# Patient Record
Sex: Female | Born: 1985
Health system: Southern US, Community
[De-identification: ages and names within clinical notes are randomized; demographics above are authoritative.]

## PROBLEM LIST (undated history)

## (undated) DIAGNOSIS — L309 Dermatitis, unspecified: Secondary | ICD-10-CM

## (undated) DIAGNOSIS — I10 Essential (primary) hypertension: Secondary | ICD-10-CM

## (undated) DIAGNOSIS — E669 Obesity, unspecified: Secondary | ICD-10-CM

## (undated) DIAGNOSIS — K219 Gastro-esophageal reflux disease without esophagitis: Secondary | ICD-10-CM

## (undated) DIAGNOSIS — K5900 Constipation, unspecified: Secondary | ICD-10-CM

## (undated) DIAGNOSIS — F419 Anxiety disorder, unspecified: Secondary | ICD-10-CM

## (undated) DIAGNOSIS — M1990 Unspecified osteoarthritis, unspecified site: Secondary | ICD-10-CM

## (undated) HISTORY — DX: Gastro-esophageal reflux disease without esophagitis: K21.9

## (undated) HISTORY — PX: TONSILLECTOMY: SUR1361

## (undated) HISTORY — DX: Obesity, unspecified: E66.9

## (undated) HISTORY — DX: Constipation, unspecified: K59.00

## (undated) HISTORY — DX: Anxiety disorder, unspecified: F41.9

## (undated) HISTORY — PX: NO PAST SURGERIES: SHX2092

## (undated) HISTORY — DX: Unspecified osteoarthritis, unspecified site: M19.90

## (undated) HISTORY — DX: Dermatitis, unspecified: L30.9

## (undated) HISTORY — DX: Essential (primary) hypertension: I10

---

## 2008-01-09 ENCOUNTER — Emergency Department: Payer: Self-pay | Admitting: Emergency Medicine

## 2008-01-31 ENCOUNTER — Emergency Department: Payer: Self-pay | Admitting: Emergency Medicine

## 2008-03-01 ENCOUNTER — Observation Stay: Payer: Self-pay | Admitting: Obstetrics & Gynecology

## 2008-04-26 ENCOUNTER — Observation Stay: Payer: Self-pay | Admitting: Obstetrics and Gynecology

## 2008-05-31 ENCOUNTER — Observation Stay: Payer: Self-pay

## 2008-06-15 ENCOUNTER — Inpatient Hospital Stay: Payer: Self-pay | Admitting: Obstetrics and Gynecology

## 2008-11-14 ENCOUNTER — Emergency Department: Payer: Self-pay | Admitting: Emergency Medicine

## 2009-03-24 ENCOUNTER — Emergency Department: Payer: Self-pay | Admitting: Emergency Medicine

## 2011-04-15 ENCOUNTER — Emergency Department: Payer: Self-pay | Admitting: Emergency Medicine

## 2011-05-23 ENCOUNTER — Emergency Department: Payer: Self-pay | Admitting: *Deleted

## 2013-05-09 ENCOUNTER — Observation Stay: Payer: Self-pay | Admitting: Obstetrics and Gynecology

## 2013-05-09 LAB — URINALYSIS, COMPLETE
Bacteria: NONE SEEN
Bilirubin,UR: NEGATIVE
Blood: NEGATIVE
Glucose,UR: NEGATIVE mg/dL (ref 0–75)
Ketone: NEGATIVE
Leukocyte Esterase: NEGATIVE
Nitrite: NEGATIVE
Ph: 7 (ref 4.5–8.0)
Protein: NEGATIVE
RBC,UR: NONE SEEN /HPF (ref 0–5)
Specific Gravity: 1.012 (ref 1.003–1.030)
Squamous Epithelial: 1
WBC UR: <1 /HPF (ref 0–5)

## 2013-05-10 LAB — URINE CULTURE

## 2013-05-31 ENCOUNTER — Observation Stay: Payer: Self-pay | Admitting: Obstetrics and Gynecology

## 2013-07-20 ENCOUNTER — Emergency Department: Payer: Self-pay | Admitting: Internal Medicine

## 2013-08-13 ENCOUNTER — Observation Stay: Payer: Self-pay

## 2013-09-10 ENCOUNTER — Observation Stay: Payer: Self-pay | Admitting: Obstetrics and Gynecology

## 2015-04-13 NOTE — H&P (Signed)
L&D Evaluation:  History Expanded:  HPI 29 yo G2 who had sex last night and comes in today with co bleeding. she is 2328w4d and she is not having another problems except for morbib obesity.   Gravida 2   Term 1   PreTerm 0   Abortion 0   Living 1   Blood Type (Maternal) unknown   Group B Strep Results Maternal (Result >5wks must be treated as unknown) positive   Maternal HIV Unknown   Maternal Syphilis Ab Unknown   Maternal Varicella Unknown   Rubella Results (Maternal) unknown   Maternal T-Dap Unknown   [facility]   Presents with vaginal bleeding   Patient's Surgical History none   Medications Pre Natal Vitamins   Allergies NKDA   Social History none   Family History Non-Contributory   ROS:  ROS All systems were reviewed.  HEENT, CNS, GI, GU, Respiratory, CV, Renal and Musculoskeletal systems were found to be normal.   Exam:  Vital Signs stable   Urine Protein not completed   General no apparent distress   Mental Status clear   Chest clear   Heart normal sinus rhythm   Abdomen gravid, non-tender   Back no CVAT   Edema no edema   Mebranes Intact   FHT Description good fht of 140   Ucx absent   Skin dry   Plan:  Plan UA   Comments cervical speculum exam to see if from cervix and check ua   Follow Up Appointment need to schedule   Electronic Signatures: Adria DevonKlett, Haliyah Fryman (MD)  (Signed 06-Jun-14 15:27)  Authored: L&D Evaluation   Last Updated: 06-Jun-14 15:27 by Adria DevonKlett, Saylee Sherrill (MD)

## 2015-04-13 NOTE — H&P (Signed)
L&D Evaluation:  History:  HPI 29 yo G2P1001 at 7945w5d gestational age by LMP and 7 week ultrasound.  She presents with concerns for no fetal movement today.  Her pregnancy is complicated by obesity.  She states that she had an ultrasound yesterday during which the sonographer had difficulty "getting the baby to move."  She has otherwise had no issues with her pregnancy.   Exam:  Vital Signs stable  afebrile.  normal range vs   General no apparent distress   Mental Status clear   Abdomen gravid, non-tender   FHT present in normal range   Other Abdominal ultrasound performed by me.  This demonstrated a single, viable Intrauterine pregnancy with very frequent movement. Heart rate in normal range.  Amniotic fluid subjectively normal.   Impression:  Impression Reassuring fetal status   Plan:  Comments provided reassurance to patient.  Showed her the monitor while I was performing ultrasound to demonstrate fetal movement.  She voiced that she was able to see and appreciate the fetal movement and noted that she was not feeling the movement of the baby even though she could see good movement on the screen.  She has an appointment in two day.  She was encouraged to keep this appt.   Follow Up App[facility]ment already scheduled. [facility]   Electronic Signatures: Conard NovakJackson, Velvie Thomaston D (MD)  (Signed 28-Jun-14 17:19)  Authored: L&D Evaluation   Last Updated: 28-Jun-14 17:19 by Conard NovakJackson, Ahyan Kreeger D (MD)

## 2015-04-13 NOTE — H&P (Signed)
L&D Evaluation:  History Expanded:  HPI 29yo G2P1 at 38w 2days who is a [facility] patient due top pt morbid obesity. she has had a vaginal delivery before. the pateint cane here tonight as she has not been able to feel the baby move. she is not feeling any contractions.   Gravida 2   Term 1   PreTerm 0   Abortion 0   Living 1   Blood Type (Maternal) O positive   Group B Strep Results Maternal (Result >5wks must be treated as unknown) unknown/result > 5 weeks ago   Maternal HIV Negative   Maternal Syphilis Ab Nonreactive   Maternal Varicella Immune   Rubella Results (Maternal) equivocal   Maternal T-Dap Unknown   [facility]   Presents with decreased fetal movement   Patient's Medical History obesity   Patient's Surgical History none   Medications Pre Natal Vitamins   Allergies Sulfa, swelluing   Social History none   Family History Non-Contributory   Current Prenatal Course Notable For Morbid Obesity   ROS:  ROS All systems were reviewed.  HEENT, CNS, GI, GU, Respiratory, CV, Renal and Musculoskeletal systems were found to be normal.   Exam:  Vital Signs stable   Urine Protein not completed   General no apparent distress   Mental Status clear   Chest clear   Heart normal sinus rhythm   Abdomen gravid, non-tender   Estimated Fetal Weight Average for gestational age   Fundal Height term woth lrge pannus   Back no CVAT   Edema no edema   Pelvic no external lesions, cervix closed and thick   Mebranes Intact   FHT Description Decreased Variability, decreased long and short term variability, baby moving all over the place and audibly heard and seen and pt states she can not feel what is being observed.   Ucx irregular   Skin dry   Lymph no lymphadenopathy   Impression:  Impression decreased fetal movement   Plan:  Plan EFM/NST   Comments pt adviseda s to when the baby is moving by sound and loss of fetal heart tracing and she  strill states she can not feel the baby move. the tracing is decreased lon and short term variabiliyt. will start IVF of D5LR. if fetus does not become strictly reactive BPP will be done and then decsiion for delivery and mode will be made. the patient is made aware that if baby does not perk up that a csection would be in order and she is not happy about the possibility, the gma asks [facility] explained that unless the tracing is reassuring she can not be transferred. if baby perks up and is reactive for a time-then shse will be discharged. gma also asks if we can call [facility] and have them come over to help. i explained how that is not a possibility. any delivery if urgent would have to be done here. since fetus is moving all over will give this a little time.   Electronic Signatures: Adria DevonKlett, Carlethia Mesquita (MD)  (Signed 09-Oct-14 00:16)  Authored: L&D Evaluation   Last Updated: 09-Oct-14 00:16 by Adria DevonKlett, Neleh Muldoon (MD)

## 2015-11-17 ENCOUNTER — Ambulatory Visit (INDEPENDENT_AMBULATORY_CARE_PROVIDER_SITE_OTHER): Payer: BLUE CROSS/BLUE SHIELD | Admitting: Physician Assistant

## 2015-11-17 VITALS — BP 122/80 | HR 80 | Temp 98.3°F | Resp 14 | Ht 65.0 in | Wt 302.0 lb

## 2015-11-17 DIAGNOSIS — B379 Candidiasis, unspecified: Secondary | ICD-10-CM

## 2015-11-17 DIAGNOSIS — N3000 Acute cystitis without hematuria: Secondary | ICD-10-CM | POA: Diagnosis not present

## 2015-11-17 DIAGNOSIS — N898 Other specified noninflammatory disorders of vagina: Secondary | ICD-10-CM

## 2015-11-17 LAB — POCT URINALYSIS DIP (MANUAL ENTRY)
Bilirubin, UA: NEGATIVE
Glucose, UA: NEGATIVE
Nitrite, UA: POSITIVE — AB
Protein Ur, POC: NEGATIVE
Spec Grav, UA: 1.025
Urobilinogen, UA: 1
pH, UA: 6

## 2015-11-17 LAB — POCT WET + KOH PREP: Trich by wet prep: ABSENT

## 2015-11-17 LAB — POC MICROSCOPIC URINALYSIS (UMFC): Mucus: ABSENT

## 2015-11-17 MED ORDER — NYSTATIN-TRIAMCINOLONE 100000-0.1 UNIT/GM-% EX OINT: 1.0000 "application " | TOPICAL_OINTMENT | Freq: Two times a day (BID) | CUTANEOUS | Status: DC

## 2015-11-17 MED ORDER — FLUCONAZOLE 150 MG PO TABS: 150.0000 mg | ORAL_TABLET | Freq: Once | ORAL | Status: DC

## 2015-11-17 MED ORDER — NITROFURANTOIN MONOHYD MACRO 100 MG PO CAPS: 100.0000 mg | ORAL_CAPSULE | Freq: Two times a day (BID) | ORAL | Status: AC

## 2015-11-17 NOTE — Patient Instructions (Signed)
Urinary Tract Infection °Urinary tract infections (UTIs) can develop anywhere along your urinary tract. Your urinary tract is your body's drainage system for removing wastes and extra water. Your urinary tract includes two kidneys, two ureters, a bladder, and a urethra. Your kidneys are a pair of bean-shaped organs. Each kidney is about the size of your fist. They are located below your ribs, one on each side of your spine. °CAUSES °Infections are caused by microbes, which are microscopic organisms, including fungi, viruses, and bacteria. These organisms are so small that they can only be seen through a microscope. Bacteria are the microbes that most commonly cause UTIs. °SYMPTOMS  °Symptoms of UTIs may vary by age and gender of the patient and by the location of the infection. Symptoms in young women typically include a frequent and intense urge to urinate and a painful, burning feeling in the bladder or urethra during urination. Older women and men are more likely to be tired, shaky, and weak and have muscle aches and abdominal pain. A fever may mean the infection is in your kidneys. Other symptoms of a kidney infection include pain in your back or sides below the ribs, nausea, and vomiting. °DIAGNOSIS °To diagnose a UTI, your caregiver will ask you about your symptoms. Your caregiver will also ask you to provide a urine sample. The urine sample will be tested for bacteria and white blood cells. White blood cells are made by your body to help fight infection. °TREATMENT  °Typically, UTIs can be treated with medication. Because most UTIs are caused by a bacterial infection, they usually can be treated with the use of antibiotics. The choice of antibiotic and length of treatment depend on your symptoms and the type of bacteria causing your infection. °HOME CARE INSTRUCTIONS °· If you were prescribed antibiotics, take them exactly as your caregiver instructs you. Finish the medication even if you feel better after  you have only taken some of the medication. °· Drink enough water and fluids to keep your urine clear or pale yellow. °· Avoid caffeine, tea, and carbonated beverages. They tend to irritate your bladder. °· Empty your bladder often. Avoid holding urine for long periods of time. °· Empty your bladder before and after sexual intercourse. °· After a bowel movement, women should cleanse from front to back. Use each tissue only once. °SEEK MEDICAL CARE IF:  °· You have back pain. °· You develop a fever. °· Your symptoms do not begin to resolve within 3 days. °SEEK IMMEDIATE MEDICAL CARE IF:  °· You have severe back pain or lower abdominal pain. °· You develop chills. °· You have nausea or vomiting. °· You have continued burning or discomfort with urination. °MAKE SURE YOU:  °· Understand these instructions. °· Will watch your condition. °· Will get help right away if you are not doing well or get worse. °  °This information is not intended to replace advice given to you by your health care provider. Make sure you discuss any questions you have with your health care provider. °  °Document Released: 08/30/2005 Document Revised: 08/11/2015 Document Reviewed: 12/29/2011 °Elsevier Interactive Patient Education ©2016 [employer] Inc[employer] ° °Monilial Vaginitis °Vaginitis in a soreness, swelling and redness (inflammation) of the vagina and vulva. Monilial vaginitis is not a sexually transmitted infection. °CAUSES  °Yeast vaginitis is caused by yeast (candida) that is normally found in your vagina. With a yeast infection, the candida has overgrown in number to a point that upsets the chemical balance. °SYMPTOMS  °·   White, thick vaginal discharge. °· Swelling, itching, redness and irritation of the vagina and possibly the lips of the vagina (vulva). °· Burning or painful urination. °· Painful intercourse. °DIAGNOSIS  °Things that may contribute to monilial vaginitis are: °· Postmenopausal and virginal  states. °· Pregnancy. °· Infections. °· Being tired, sick or stressed, especially if you had monilial vaginitis in the past. °· Diabetes. Good control will help lower the chance. °· Birth control pills. °· Tight fitting garments. °· Using bubble bath, feminine sprays, douches or deodorant tampons. °· Taking certain medications that kill germs (antibiotics). °· Sporadic recurrence can occur if you become ill. °TREATMENT  °Your caregiver will give you medication. °· There are several kinds of anti monilial vaginal creams and suppositories specific for monilial vaginitis. For recurrent yeast infections, use a suppository or cream in the vagina 2 times a week, or as directed. °· Anti-monilial or steroid cream for the itching or irritation of the vulva may also be used. Get your caregiver's permission. °· Painting the vagina with methylene blue solution may help if the monilial cream does not work. °· Eating yogurt may help prevent monilial vaginitis. °HOME CARE INSTRUCTIONS  °· Finish all medication as prescribed. °· Do not have sex until treatment is completed or after your caregiver tells you it is okay. °· Take warm sitz baths. °· Do not douche. °· Do not use tampons, especially scented ones. °· Wear cotton underwear. °· Avoid tight pants and panty hose. °· Tell your sexual partner that you have a yeast infection. They should go to their caregiver if they have symptoms such as mild rash or itching. °· Your sexual partner should be treated as well if your infection is difficult to eliminate. °· Practice safer sex. Use condoms. °· Some vaginal medications cause latex condoms to fail. Vaginal medications that harm condoms are: °¨ Cleocin cream. °¨ Butoconazole (Femstat®). °¨ Terconazole (Terazol®) vaginal suppository. °¨ Miconazole (Monistat®) (may be purchased over the counter). °SEEK MEDICAL CARE IF:  °· You have a temperature by mouth above 102° F (38.9° C). °· The infection is getting worse after 2 days of  treatment. °· The infection is not getting better after 3 days of treatment. °· You develop blisters in or around your vagina. °· You develop vaginal bleeding, and it is not your menstrual period. °· You have pain when you urinate. °· You develop intestinal problems. °· You have pain with sexual intercourse. °  °This information is not intended to replace advice given to you by your health care provider. Make sure you discuss any questions you have with your health care provider. °  °Document Released: 08/30/2005 Document Revised: 02/12/2012 Document Reviewed: 05/24/2015 °Elsevier Interactive Patient Education ©2016 Elsevier Inc. ° °

## 2015-11-17 NOTE — Progress Notes (Signed)
[facility] [address], [place] [address] [phone]  Date:  11/17/2015   Name[facility]  [facility] R [facility]   DOB:  [birth year]   MRN:  [id number]  PCP:  No primary care provider on file.    History of Present Illness:  Kathryn Harvey is a 29 y.o. female patient who [facility].  Burns when she wipes with tissue.  There is no frequency or hematuria.  She denies fever, nausea, or abdominal pain.  She has attempted azo which offered relief.  Abnormal vaginal discharge which appears orange.  Mild vaginal pruritus. Sexually active-1 month ago with a condom.   No back pain or flank pain.  Started afterward.  Stinging burning.   There are no active problems to display for this patient.   No past medical history on file.  No past surgical history on file.  Social History  Substance Use Topics  . Smoking status: Current Every Day Smoker -- 2 years    Types: Cigars  . Smokeless tobacco: Not on file  . Alcohol Use: No    No family history on file.  Allergies  Allergen Reactions  . Sulfa Antibiotics Swelling    Medication list has been reviewed and updated.  No current outpatient prescriptions on file prior to visit.   No current facility-administered medications on file prior to visit.    ROS ROS otherwise unremarkable unless listed above.  Physical Examination: BP 122/80 mmHg  Pulse 80  Temp(Src) 98.3 F (36.8 C) (Oral)  Resp 14  Ht 5\' 5"  (1.651 m)  Wt 302 lb (136.986 kg)  BMI 50.26 kg/m2  SpO2 98% Ideal Body Weight: Weight in (lb) to have BMI = 25: 149.9  Physical Exam  Constitutional: She is oriented to person, place, and time. She appears well-developed and well-nourished. No distress.  HENT:  Head: Normocephalic and atraumatic.  Right Ear: External ear normal.  Left Ear: External ear normal.  Eyes: Conjunctivae and EOM are normal. Pupils are equal, round, and reactive to light.  Cardiovascular:  Normal rate.   Pulmonary/Chest: Effort normal. No respiratory distress.  Genitourinary: Pelvic exam was performed with patient supine. There is no rash on the right labia. There is no rash on the left labia. Vaginal discharge found.  Neurological: She is alert and oriented to person, place, and time.  Skin: She is not diaphoretic.  Psychiatric: She has a normal mood and affect. Her behavior is normal.    Assessment and Plan: Kathryn Harvey is a 29 y.o. female who is here today for vaginal pruritus and dysuria.   -treating with microbid.   -given diflucan, and nystatin to treat vaginal pruritus.  -gc chlamydia obtained today Acute cystitis without hematuria - Plan: nitrofurantoin, macrocrystal-monohydrate, (MACROBID) 100 MG capsule, Urine culture  Vaginal discharge - Plan: POCT Wet + KOH Prep, POCT Microscopic Urinalysis ([facility]), GC/Chlamydia Probe Amp, POCT urinalysis dipstick  Vaginal irritation - Plan: nystatin-triamcinolone ointment (MYCOLOG)  Yeast infection - Plan: fluconazole (DIFLUCAN) 150 MG tablet   Trena PlattStephanie Stetson Pelaez, PA-C [facility] 11/17/2015 1:01 PM

## 2015-11-18 LAB — URINE CULTURE: Colony Count: 40000

## 2015-11-18 LAB — GC/CHLAMYDIA PROBE AMP
CT Probe RNA: NOT DETECTED
GC Probe RNA: NOT DETECTED

## 2016-01-07 ENCOUNTER — Emergency Department (HOSPITAL_COMMUNITY)
Admission: EM | Admit: 2016-01-07 | Discharge: 2016-01-07 | Disposition: A | Discharge status: Home / Self Care | Payer: BLUE CROSS/BLUE SHIELD | Attending: Physician Assistant | Admitting: Physician Assistant

## 2016-01-07 ENCOUNTER — Encounter (HOSPITAL_COMMUNITY): Payer: Self-pay | Admitting: *Deleted

## 2016-01-07 DIAGNOSIS — K002 Abnormalities of size and form of teeth: Secondary | ICD-10-CM | POA: Diagnosis not present

## 2016-01-07 DIAGNOSIS — Z79899 Other long term (current) drug therapy: Secondary | ICD-10-CM | POA: Diagnosis not present

## 2016-01-07 DIAGNOSIS — F17210 Nicotine dependence, cigarettes, uncomplicated: Secondary | ICD-10-CM | POA: Diagnosis not present

## 2016-01-07 DIAGNOSIS — K0889 Other specified disorders of teeth and supporting structures: Secondary | ICD-10-CM | POA: Diagnosis present

## 2016-01-07 DIAGNOSIS — G478 Other sleep disorders: Secondary | ICD-10-CM | POA: Diagnosis not present

## 2016-01-07 DIAGNOSIS — K029 Dental caries, unspecified: Secondary | ICD-10-CM | POA: Insufficient documentation

## 2016-01-07 MED ORDER — AMOXICILLIN 500 MG PO CAPS: 500.0000 mg | ORAL_CAPSULE | Freq: Three times a day (TID) | ORAL | Status: DC

## 2016-01-07 NOTE — Discharge Instructions (Signed)

## 2016-01-07 NOTE — ED Notes (Signed)
Pt c/o right lower jaw dental pain; pt states that she has had an infection in that area in the past; pt states that she is scheduled for a tooth extraction on 2/24; pt states that she began hurting on Wed and has been unable to get relief

## 2016-01-07 NOTE — ED Provider Notes (Signed)
CSN: [id number]     Arrival date & time 01/07/16  2225 History   First MD Initiated Contact with Patient 01/07/16 2244     Chief Complaint  Patient presents with  . Dental Pain     (Consider location/radiation/quality/duration/timing/severity/associated sxs/prior Treatment) HPI Comments: Patient presents to the emergency department with chief complaint of bilateral lower dental pain. Patient states that she is scheduled for wisdom tooth extraction on 2/24. She states that her teeth began hurting her 2 days ago. She denies fevers or chills. She has tried OTC medications with no relief. She states that she was taking amoxicillin for an ear infection, and this helped with her dental pain. She is concerned that she might be developing further infection. There are no other associated symptoms.  The history is provided by the patient. No language interpreter was used.    History reviewed. No pertinent past medical history. History reviewed. No pertinent past surgical history. No family history on file. Social History  Substance Use Topics  . Smoking status: Current Every Day Smoker -- 2 years    Types: Cigars  . Smokeless tobacco: None  . Alcohol Use: No   OB History    No data available     Review of Systems  Constitutional: Negative for fever and chills.  HENT: Positive for dental problem. Negative for drooling.   Neurological: Negative for speech difficulty.  Psychiatric/Behavioral: Positive for sleep disturbance.      Allergies  Sulfa antibiotics  Home Medications   Prior to Admission medications   Medication Sig Start Date End Date Taking? Authorizing Provider  ferrous sulfate 325 (65 FE) MG tablet Take 325 mg by mouth daily with breakfast.   Yes Historical Provider, MD  ibuprofen (ADVIL,MOTRIN) 200 MG tablet Take 800 mg by mouth every 6 (six) hours as needed for moderate pain.   Yes Historical Provider, MD  PRESCRIPTION MEDICATION Take 1 tablet by mouth daily. OCPs   Yes  Historical Provider, MD  fluconazole (DIFLUCAN) 150 MG tablet Take 1 tablet (150 mg total) by mouth once. Repeat if needed Patient not taking: Reported on 01/07/2016 11/17/15   Collie Siad English, PA  nystatin-triamcinolone ointment [facility]) Apply 1 application topically 2 (two) times daily. Patient not taking: Reported on 01/07/2016 11/17/15   Collie Siad English, PA   BP 169/113 mmHg  Pulse 75  Temp(Src) 98.5 F (36.9 C) (Oral)  Resp 20  SpO2 98%  LMP 12/17/2015 Physical Exam Physical Exam  Constitutional: Pt appears well-developed and well-nourished.  HENT:  Head: Normocephalic.  Right Ear: Tympanic membrane, external ear and ear canal normal.  Left Ear: Tympanic membrane, external ear and ear canal normal.  Nose: Nose normal. Right sinus exhibits no maxillary sinus tenderness and no frontal sinus tenderness. Left sinus exhibits no maxillary sinus tenderness and no frontal sinus tenderness.  Mouth/Throat: Uvula is midline, oropharynx is clear and moist and mucous membranes are normal. No oral lesions. Abnormal dentition. Dental caries present. No uvula swelling or lacerations. No oropharyngeal exudate, posterior oropharyngeal edema, posterior oropharyngeal erythema or tonsillar abscesses.  No gingival swelling, fluctuance or induration No gross abscess  Eyes: Conjunctivae are normal. Pupils are equal, round, and reactive to light. Right eye exhibits no discharge. Left eye exhibits no discharge.  Neck: Normal range of motion. Neck supple.  No stridor Handling secretions without difficulty No nuchal rigidity No cervical lymphadenopathy   Cardiovascular: Normal rate, regular rhythm and normal heart sounds.   Pulmonary/Chest: Effort normal. No respiratory distress.  Equal  chest rise  Abdominal: Soft. Bowel sounds are normal. Pt exhibits no distension. There is no tenderness.  Lymphadenopathy:    Pt has no cervical adenopathy.  Neurological: Pt is alert.  Skin: Skin is warm and dry.   Psychiatric: Pt has a normal mood and affect.  Nursing note and vitals reviewed.    ED Course  Procedures (including critical care time)   MDM   Final diagnoses:  Pain, dental    Patient with toothache.  No gross abscess.  Exam unconcerning for Ludwig's angina or spread of infection.  Will treat with amox and otc pain medicine.  Urged patient to follow-up with dentist.      Roxy Horseman, PA-C 01/07/16 2255  Courteney Randall An, MD 01/08/16 [phone]

## 2017-07-10 ENCOUNTER — Other Ambulatory Visit (HOSPITAL_COMMUNITY)
Admission: RE | Admit: 2017-07-10 | Discharge: 2017-07-10 | Disposition: A | Discharge status: Home / Self Care | Payer: BLUE CROSS/BLUE SHIELD | Source: Ambulatory Visit | Attending: Internal Medicine | Admitting: Internal Medicine

## 2017-07-10 ENCOUNTER — Other Ambulatory Visit: Payer: Self-pay | Admitting: Internal Medicine

## 2017-07-10 DIAGNOSIS — Z124 Encounter for screening for malignant neoplasm of cervix: Secondary | ICD-10-CM | POA: Insufficient documentation

## 2017-07-12 LAB — CYTOLOGY - PAP
Chlamydia: NEGATIVE
Diagnosis: NEGATIVE
Neisseria Gonorrhea: NEGATIVE

## 2019-03-13 DIAGNOSIS — J029 Acute pharyngitis, unspecified: Secondary | ICD-10-CM | POA: Diagnosis not present

## 2019-03-21 ENCOUNTER — Other Ambulatory Visit: Payer: Self-pay

## 2019-03-21 ENCOUNTER — Encounter (HOSPITAL_COMMUNITY): Payer: Self-pay

## 2019-03-21 ENCOUNTER — Emergency Department (HOSPITAL_COMMUNITY)
Admission: EM | Admit: 2019-03-21 | Discharge: 2019-03-21 | Disposition: A | Discharge status: Home / Self Care | Payer: 59 | Attending: Emergency Medicine | Admitting: Emergency Medicine

## 2019-03-21 DIAGNOSIS — R1310 Dysphagia, unspecified: Secondary | ICD-10-CM | POA: Diagnosis not present

## 2019-03-21 DIAGNOSIS — F17290 Nicotine dependence, other tobacco product, uncomplicated: Secondary | ICD-10-CM | POA: Diagnosis not present

## 2019-03-21 DIAGNOSIS — R070 Pain in throat: Secondary | ICD-10-CM | POA: Diagnosis present

## 2019-03-21 MED ORDER — OMEPRAZOLE 20 MG PO CPDR: 20.0000 mg | DELAYED_RELEASE_CAPSULE | Freq: Two times a day (BID) | ORAL | 0 refills | Status: DC

## 2019-03-21 NOTE — ED Notes (Signed)
ED Provider at bedside. 

## 2019-03-21 NOTE — ED Triage Notes (Signed)
Pt here for a sore throat and difficulty swallowing for the last week.  Initially pt saw PCP who told pt to take zyrtec and some OTC decongestant pills. No relief with interventions. A&Ox4, afebrile, states she feels like something is in her throat.

## 2019-03-21 NOTE — ED Provider Notes (Signed)
[facility] Note   CSN: [id number] Arrival date & time: 03/21/19  0112    History   Chief Complaint Chief Complaint  Patient presents with  . Sore Throat    HPI Kathryn Harvey is a 33 y.o. female.     Patient presents to the ED with a chief complaint of difficulty swallowing.  She states that she has been having the trouble for the past week or so.  She states that it feels like the food or drink passes more slowly than normal.  She denies sore throat, fever, chills, or cough.  She states that she does have some discomfort associated with the symptoms.  She reports having tried zyrtec and sudafed with no relief.    The history is provided by the patient. No language interpreter was used.    History reviewed. No pertinent past medical history.  There are no active problems to display for this patient.   History reviewed. No pertinent surgical history.   OB History   No obstetric history on file.      Home Medications    Prior to Admission medications   Medication Sig Start Date End Date Taking? Authorizing Provider  amoxicillin (AMOXIL) 500 MG capsule Take 1 capsule (500 mg total) by mouth 3 (three) times daily. 01/07/16   Roxy HorsemanBrowning, Briggitte Boline, PA-C  ferrous sulfate 325 (65 FE) MG tablet Take 325 mg by mouth daily with breakfast.    [provider]  fluconazole (DIFLUCAN) 150 MG tablet Take 1 tablet (150 mg total) by mouth once. Repeat if needed Patient not taking: Reported on 01/07/2016 11/17/15   Trena PlattEnglish, Stephanie D, PA  ibuprofen (ADVIL,MOTRIN) 200 MG tablet Take 800 mg by mouth every 6 (six) hours as needed for moderate pain.    [provider]  nystatin-triamcinolone ointment (MYCOLOG) Apply 1 application topically 2 (two) times daily. Patient not taking: Reported on 01/07/2016 11/17/15   Trena PlattEnglish, Stephanie D, PA  omeprazole (PRILOSEC) 20 MG capsule Take 1 capsule (20 mg total) by mouth 2 (two)  times daily before a meal. 03/21/19   Roxy HorsemanBrowning, Shamell Suarez, PA-C  PRESCRIPTION MEDICATION Take 1 tablet by mouth daily. OCPs    [provider]    Family History History reviewed. No pertinent family history.  Social History Social History   Tobacco Use  . Smoking status: Current Every Day Smoker    Years: 2.00    Types: Cigars  Substance Use Topics  . Alcohol use: No    Alcohol/week: 0.0 standard drinks  . Drug use: No     Allergies   Sulfa antibiotics   Review of Systems Review of Systems  All other systems reviewed and are negative.    Physical Exam Updated Vital Signs BP 123/67 (BP Location: Right Arm)   Pulse 73   Temp 98.8 F (37.1 C) (Oral)   Resp 18   SpO2 99%   Physical Exam Vitals signs and nursing note reviewed.  Constitutional:      General: She is not in acute distress.    Appearance: She is well-developed.  HENT:     Head: Normocephalic and atraumatic.     Mouth/Throat:     Comments: Oropharynx is clear, no exudate, no abscess, normal phonation, no stridor Eyes:     Conjunctiva/sclera: Conjunctivae normal.  Neck:     Musculoskeletal: Neck supple.  Cardiovascular:     Rate and Rhythm: Normal rate and regular rhythm.     Heart sounds:  No murmur.  Pulmonary:     Effort: Pulmonary effort is normal. No respiratory distress.     Breath sounds: Normal breath sounds.  Abdominal:     Palpations: Abdomen is soft.     Tenderness: There is no abdominal tenderness.  Skin:    General: Skin is warm and dry.  Neurological:     Mental Status: She is alert.      ED Treatments / Results  Labs (all labs ordered are listed, but only abnormal results are displayed) Labs Reviewed - No data to display  EKG None  Radiology No results found.  Procedures Procedures (including critical care time)  Medications Ordered in ED Medications - No data to display   Initial Impression / Assessment and Plan / ED Course  I have reviewed the triage  vital signs and the nursing notes.  Pertinent labs & imaging results that were available during my care of the patient were reviewed by me and considered in my medical decision making (see chart for details).        Patient with reported difficultly swallowing x 1 week.  Reports taking longer to swallow foods, and that it feels like it gets stuck.  She is able to swallow food and drink, and has not had vomiting.    She states that she has tried zyrtec and sudafed with no relief.    Oropharynx is clear, doubt infection.  VSS.  Consider GERD vs dysmotility disorder. Will treat with omeprazole and recommend follow-up with GI.  Return precautions discussed.  Final Clinical Impressions(s) / ED Diagnoses   Final diagnoses:  Dysphagia, unspecified type    ED Discharge Orders         Ordered    omeprazole (PRILOSEC) 20 MG capsule  2 times daily before meals     03/21/19 0145           Roxy Horseman, PA-C 03/21/19 0151    Glynn Octave, MD 03/21/19 [phone]

## 2019-03-21 NOTE — ED Notes (Signed)
Patient Alert and oriented to baseline. Stable and ambulatory to baseline. Patient verbalized understanding of the discharge instructions.  Patient belongings were taken by the patient.   

## 2019-06-02 DIAGNOSIS — I10 Essential (primary) hypertension: Secondary | ICD-10-CM | POA: Diagnosis not present

## 2019-06-02 DIAGNOSIS — Z3041 Encounter for surveillance of contraceptive pills: Secondary | ICD-10-CM | POA: Diagnosis not present

## 2019-06-02 DIAGNOSIS — Z01419 Encounter for gynecological examination (general) (routine) without abnormal findings: Secondary | ICD-10-CM | POA: Diagnosis not present

## 2019-06-02 DIAGNOSIS — Z6841 Body Mass Index (BMI) 40.0 and over, adult: Secondary | ICD-10-CM | POA: Diagnosis not present

## 2019-06-02 DIAGNOSIS — Z113 Encounter for screening for infections with a predominantly sexual mode of transmission: Secondary | ICD-10-CM | POA: Diagnosis not present

## 2019-07-04 DIAGNOSIS — I10 Essential (primary) hypertension: Secondary | ICD-10-CM | POA: Diagnosis not present

## 2019-07-04 DIAGNOSIS — K219 Gastro-esophageal reflux disease without esophagitis: Secondary | ICD-10-CM | POA: Diagnosis not present

## 2019-07-23 DIAGNOSIS — Z720 Tobacco use: Secondary | ICD-10-CM | POA: Diagnosis not present

## 2019-07-23 DIAGNOSIS — K219 Gastro-esophageal reflux disease without esophagitis: Secondary | ICD-10-CM | POA: Diagnosis not present

## 2019-07-23 DIAGNOSIS — R221 Localized swelling, mass and lump, neck: Secondary | ICD-10-CM | POA: Diagnosis not present

## 2019-07-28 ENCOUNTER — Other Ambulatory Visit: Payer: Self-pay | Admitting: Internal Medicine

## 2019-07-28 DIAGNOSIS — R221 Localized swelling, mass and lump, neck: Secondary | ICD-10-CM

## 2019-08-06 ENCOUNTER — Ambulatory Visit
Admission: RE | Admit: 2019-08-06 | Discharge: 2019-08-06 | Disposition: A | Discharge status: Home / Self Care | Payer: 59 | Source: Ambulatory Visit | Attending: Internal Medicine | Admitting: Internal Medicine

## 2019-08-06 DIAGNOSIS — E049 Nontoxic goiter, unspecified: Secondary | ICD-10-CM | POA: Diagnosis not present

## 2019-08-06 DIAGNOSIS — R221 Localized swelling, mass and lump, neck: Secondary | ICD-10-CM

## 2019-10-06 ENCOUNTER — Ambulatory Visit
Admission: RE | Admit: 2019-10-06 | Discharge: 2019-10-06 | Disposition: A | Discharge status: Home / Self Care | Payer: 59 | Source: Ambulatory Visit | Attending: Internal Medicine | Admitting: Internal Medicine

## 2019-10-06 ENCOUNTER — Other Ambulatory Visit: Payer: Self-pay | Admitting: Internal Medicine

## 2019-10-06 DIAGNOSIS — M25461 Effusion, right knee: Secondary | ICD-10-CM

## 2019-10-06 DIAGNOSIS — M7989 Other specified soft tissue disorders: Secondary | ICD-10-CM | POA: Diagnosis not present

## 2019-10-09 DIAGNOSIS — M25561 Pain in right knee: Secondary | ICD-10-CM | POA: Diagnosis not present

## 2019-10-29 DIAGNOSIS — R5383 Other fatigue: Secondary | ICD-10-CM | POA: Diagnosis not present

## 2019-10-29 DIAGNOSIS — I10 Essential (primary) hypertension: Secondary | ICD-10-CM | POA: Diagnosis not present

## 2019-11-06 ENCOUNTER — Other Ambulatory Visit: Payer: Self-pay | Admitting: General Surgery

## 2019-11-06 ENCOUNTER — Other Ambulatory Visit (HOSPITAL_COMMUNITY): Payer: Self-pay | Admitting: General Surgery

## 2019-12-15 ENCOUNTER — Ambulatory Visit (HOSPITAL_COMMUNITY)
Admission: RE | Admit: 2019-12-15 | Discharge: 2019-12-15 | Disposition: A | Discharge status: Home / Self Care | Payer: No Typology Code available for payment source | Source: Ambulatory Visit | Attending: General Surgery | Admitting: General Surgery

## 2019-12-15 ENCOUNTER — Other Ambulatory Visit: Payer: Self-pay

## 2019-12-24 ENCOUNTER — Encounter: Payer: Self-pay | Admitting: Dietician

## 2019-12-24 ENCOUNTER — Other Ambulatory Visit: Payer: Self-pay

## 2019-12-24 ENCOUNTER — Encounter: Payer: No Typology Code available for payment source | Attending: General Surgery | Admitting: Dietician

## 2019-12-24 DIAGNOSIS — E669 Obesity, unspecified: Secondary | ICD-10-CM | POA: Insufficient documentation

## 2019-12-24 NOTE — Progress Notes (Signed)
Nutrition Assessment for Bariatric Surgery Medical Nutrition Therapy  Appt Start Time: 8:50am    End Time: 9:45am  Patient was seen on 12/24/2019 for Pre-Operative Nutrition Assessment. Letter of approval faxed to [facility] bariatric surgery program coordinator on 12/24/2019.   Referral stated Supervised Weight Loss (SWL) visits needed: 0  Planned surgery: Sleeve Pt expectation of surgery: to maintain weight loss, come off of high blood pressure medication, be more active  Pt expectation of dietitian: answer questions about diet-related expectations before/after surgery   NUTRITION ASSESSMENT   Anthropometrics  Start weight at [facility]: 285.7 lbs (date: 12/24/2019) Height: 65 in BMI: 47.5 kg/m2     Lifestyle & Dietary Hx Patient works for [employer]. Lives with her daughters. Patient performs the food shopping/preparation, typical meal pattern is ~3 meals per day, may eat out 2-3 times per week.    24-Hr Dietary Recall First Meal: green tea shake Snack: - Second Meal: popcorn + meal replacement shake  Snack: - Third Meal: Nutri-Grain bar or crackers  Snack: - Beverages: water, soda   Estimated Energy Needs Calories: 1600-1800 Carbohydrate: 180-200g Protein: 100-113g Fat: 53-60g   NUTRITION DIAGNOSIS  Overweight/obesity ([place]-3.3) related to past poor dietary habits and physical inactivity as evidenced by patient w/ planned Sleeve Gastrectomy surgery following dietary guidelines for continued weight loss.    NUTRITION INTERVENTION  Nutrition counseling (C-1) and education (E-2) to facilitate bariatric surgery goals.  Pre-Op Goals Reviewed with the Patient . Track food and beverage intake (pen and paper, MyFitness Pal, Baritastic app, etc.) . Make healthy food choices while monitoring portion sizes . Consume 3 meals per day or try to eat every 3-5 hours . Avoid concentrated sugars and fried foods . Keep sugar & fat in the single digits per  serving on food labels . Practice CHEWING your food (aim for applesauce consistency) . Practice not drinking 15 minutes before, during, and 30 minutes after each meal and snack . Avoid all carbonated beverages (ex: soda, sparkling beverages)  . Limit caffeinated beverages (ex: coffee, tea, energy drinks) . Avoid all sugar-sweetened beverages (ex: regular soda, sports drinks)  . Avoid alcohol  . Aim for 64-100 ounces of FLUID daily (with at least half of fluid intake being plain water)  . Aim for at least 60-80 grams of PROTEIN daily . Look for a liquid protein source that contains ?15 g protein and ?5 g carbohydrate (ex: shakes, drinks, shots) . Make a list of non-food related activities . Physical activity is an important part of a healthy lifestyle so keep it moving! The goal is to reach 150 minutes of exercise per week, including cardiovascular and weight baring activity.  Handouts Provided Include  . Bariatric Surgery handouts (Nutrition Visits, Pre-Op Goals, Protein Shakes, Vitamins & Minerals)  Learning Style & Readiness for Change Teaching method utilized: Visual & Auditory  Demonstrated degree of understanding via: Teach Back  Barriers to learning/adherence to lifestyle change: None Identified   MONITORING & EVALUATION Dietary intake, weekly physical activity, body weight, and pre-op goals reached at next nutrition visit.   Next Steps Patient is to follow up at [facility] for Pre-Op Class (>2 weeks before surgery) for further nutrition education.

## 2020-01-01 ENCOUNTER — Ambulatory Visit: Payer: Self-pay | Admitting: Psychology

## 2020-01-06 ENCOUNTER — Ambulatory Visit: Payer: No Typology Code available for payment source | Admitting: Psychology

## 2020-01-06 ENCOUNTER — Ambulatory Visit (INDEPENDENT_AMBULATORY_CARE_PROVIDER_SITE_OTHER): Payer: No Typology Code available for payment source | Admitting: Psychology

## 2020-01-15 ENCOUNTER — Ambulatory Visit: Payer: No Typology Code available for payment source | Admitting: Psychology

## 2020-02-09 ENCOUNTER — Encounter: Payer: No Typology Code available for payment source | Attending: General Surgery | Admitting: Skilled Nursing Facility1

## 2020-02-09 ENCOUNTER — Other Ambulatory Visit: Payer: Self-pay

## 2020-02-09 DIAGNOSIS — E669 Obesity, unspecified: Secondary | ICD-10-CM | POA: Diagnosis not present

## 2020-02-09 NOTE — Progress Notes (Signed)
Pre-Operative Nutrition Class:  Appt start time: 3403   End time:  1830.  Patient was seen on 02/09/2020 for Pre-Operative Bariatric Surgery Education [facility].   Surgery date:  Surgery type: SLEEVE Start weight at [facility] [address]: 285.7 Weight today: 291.6  Samples given per MNT protocol. Patient educated on appropriate usage:    Bariatric Advantage Multivitamin Lot #[id number] Exp:08/21  Bariatric Advantage Calcium  Lot #[id number] Exp:12/06/2020  Protein Shake Lot #[id number] Exp:04/20/2021  The following the learning objectives were met by the patient during this course:  Identify Pre-Op Dietary Goals and will begin 2 weeks pre-operatively  Identify appropriate sources of fluids and proteins   State protein recommendations and appropriate sources pre and post-operatively  Identify Post-Operative Dietary Goals and will follow for 2 weeks post-operatively  Identify appropriate multivitamin and calcium sources  Describe the need for physical activity post-operatively and will follow MD recommendations  State when to call healthcare provider regarding medication questions or post-operative complications  Handouts given during class include:  Pre-Op Bariatric Surgery Diet Handout  Protein Shake Handout  Post-Op Bariatric Surgery Nutrition Handout  [facility] Program Information Flyer  Support Group Information Flyer  [facility] Bariatric Supplements Price List  Follow-Up Plan: Patient will follow-up at [facility] 2 weeks post operatively for diet advancement per MD.

## 2020-02-23 ENCOUNTER — Ambulatory Visit: Payer: No Typology Code available for payment source | Admitting: Psychology

## 2020-03-01 ENCOUNTER — Ambulatory Visit: Payer: Self-pay | Admitting: General Surgery

## 2020-03-01 NOTE — Progress Notes (Signed)
PCP - Lorenda Ishihara Cardiologist -   Chest x-ray - 12-15-19 epic EKG - 12-15-19 epic Stress Test -  ECHO -  Cardiac Cath -   Sleep Study -  CPAP -   Fasting Blood Sugar -  Checks Blood Sugar _____ times a day  Blood Thinner Instructions: Aspirin Instructions: Last Dose:  Anesthesia review:   Patient denies shortness of breath, fever, cough and chest pain at PAT appointment  NONE   Patient verbalized understanding of instructions that were given to them at the PAT appointment. Patient was also instructed that they will need to review over the PAT instructions again at home before surgery.

## 2020-03-01 NOTE — Patient Instructions (Addendum)
DUE TO COVID-19 ONLY ONE VISITOR IS ALLOWED TO COME WITH YOU AND STAY IN THE WAITING ROOM ONLY DURING PRE OP AND PROCEDURE DAY OF SURGERY. Two  VISITORs  MAY VISIT WITH YOU AFTER SURGERY IN YOUR PRIVATE ROOM DURING VISITING HOURS ONLY!  10 am -8pm  YOU NEED TO HAVE A COVID 19 TEST ON__4-1-21_____ @_8 :10am______, THIS TEST MUST BE DONE BEFORE SURGERY, COME  [address], Kathryn Harvey , .  [facility]) ONCE YOUR COVID TEST IS COMPLETED, PLEASE BEGIN THE QUARANTINE INSTRUCTIONS AS OUTLINED IN YOUR HANDOUT.                Kathryn Harvey [facility]  03/01/2020   Your procedure is scheduled on: 03-08-20   Report to [facility] Main  Entrance   Report to short stay  at      0530 AM     Call this number if you have problems the morning of surgery [phone]    Remember: MORNING OF SURGERY DRINK:   DRINK 1 G2 drink BEFORE YOU LEAVE HOME, DRINK ALL OF THE  G2 DRINK AT ONE TIME.   NO SOLID FOOD AFTER 600 PM THE NIGHT BEFORE YOUR SURGERY. YOU MAY DRINK CLEAR FLUIDS. THE G2 DRINK YOU DRINK BEFORE YOU LEAVE HOME WILL BE THE LAST FLUIDS YOU DRINK BEFORE SURGERY.  PAIN IS EXPECTED AFTER SURGERY AND WILL NOT BE COMPLETELY ELIMINATED. AMBULATION AND TYLENOL WILL HELP REDUCE INCISIONAL AND GAS PAIN. MOVEMENT IS KEY!  YOU ARE EXPECTED TO BE OUT OF BED WITHIN 4 HOURS OF ADMISSION TO YOUR PATIENT ROOM.  SITTING IN THE RECLINER THROUGHOUT THE DAY IS IMPORTANT FOR DRINKING FLUIDS AND MOVING GAS THROUGHOUT THE GI TRACT.  COMPRESSION STOCKINGS SHOULD BE WORN [facility] STAY UNLESS YOU ARE WALKING.   INCENTIVE SPIROMETER SHOULD BE USED EVERY HOUR WHILE AWAKE TO DECREASE POST-OPERATIVE COMPLICATIONS SUCH AS PNEUMONIA.  WHEN DISCHARGED HOME, IT IS IMPORTANT TO CONTINUE TO WALK EVERY HOUR AND USE THE INCENTIVE SPIROMETER EVERY HOUR.      CLEAR LIQUID DIET   Foods Allowed                                                                                          Foods Excluded  Coffee and tea, regular and decaf  No creamer                                           liquids that you cannot  Plain Jell-O any favor except red or purple                                           see through such as: Fruit ices (not with fruit pulp)  milk, soups, orange juice  Iced Popsicles                                                                 All solid food                               Cranberry, grape and apple juices Sports drinks like Gatorade Lightly seasoned clear broth or consume(fat free) Sugar, honey syrup  Sample Menu Breakfast                                Lunch                                     Supper Cranberry juice                    Beef broth                            Chicken broth Jell-O                                     Grape juice                           Apple juice Coffee or tea                        Jell-O                                      Popsicle                                                Coffee or tea                        Coffee or tea  _____________________________________________________________________     BRUSH YOUR TEETH MORNING OF SURGERY AND RINSE YOUR MOUTH OUT, NO CHEWING GUM CANDY OR MINTS.     Take these medicines the morning of surgery with A SIP OF WATER: NONE                                 You may not have any metal on your body including hair pins and              piercings  Do not wear jewelry, make-up, lotions, powders or perfumes, deodorant             Do not wear nail polish on your fingernails.  Do not shave  48 hours prior to surgery.  Do not bring valuables to the hospital. [facility] IS NOT             RESPONSIBLE   FOR VALUABLES.  Contacts, dentures or bridgework may not be worn into surgery.                  Please read over the following fact sheets you were  given: _____________________________________________________________________           [facility] - Preparing for Surgery Before surgery, you can play an important role.  Because skin is not sterile, your skin needs to be as free of germs as possible.  You can reduce the number of germs on your skin by washing with CHG (chlorahexidine gluconate) soap before surgery.  CHG is an antiseptic cleaner which kills germs and bonds with the skin to continue killing germs even after washing. Please DO NOT use if you have an allergy to CHG or antibacterial soaps.  If your skin becomes reddened/irritated stop using the CHG and inform your nurse when you arrive at Short Stay. Do not shave (including legs and underarms) for at least 48 hours prior to the first CHG shower.  You may shave your face/neck. Please follow these instructions carefully:  1.  Shower with CHG Soap the night before surgery and the  morning of Surgery.  2.  If you choose to wash your hair, wash your hair first as usual with your  normal  shampoo.  3.  After you shampoo, rinse your hair and body thoroughly to remove the  shampoo.                           4.  Use CHG as you would any other liquid soap.  You can apply chg directly  to the skin and wash                       Gently with a scrungie or clean washcloth.  5.  Apply the CHG Soap to your body ONLY FROM THE NECK DOWN.   Do not use on face/ open                           Wound or open sores. Avoid contact with eyes, ears mouth and genitals (private parts).                       Wash face,  Genitals (private parts) with your normal soap.             6.  Wash thoroughly, paying special attention to the area where your surgery  will be performed.  7.  Thoroughly rinse your body with warm water from the neck down.  8.  DO NOT shower/wash with your normal soap after using and rinsing off  the CHG Soap.                9.  Pat yourself dry with a clean towel.            10.  Wear clean  pajamas.            11.  Place clean sheets on your bed the night of your first shower and do not  sleep with pets. Day of Surgery : Do not apply any lotions/deodorants the morning of surgery.  Please wear clean clothes to the hospital/surgery center.  FAILURE TO FOLLOW THESE INSTRUCTIONS MAY RESULT IN THE CANCELLATION OF YOUR SURGERY PATIENT SIGNATURE_________________________________  NURSE SIGNATURE__________________________________  ________________________________________________________________________   Kathryn Harvey  An incentive spirometer is a tool that can help keep your lungs clear and active. This tool measures how well you are filling your lungs with each breath. Taking long deep breaths may help reverse or decrease the chance of developing breathing (pulmonary) problems (especially infection) following:  A long period of time when you are unable to move or be active. BEFORE THE PROCEDURE   If the spirometer includes an indicator to show your best effort, your nurse or respiratory therapist will set it to a desired goal.  If possible, sit up straight or lean slightly forward. Try not to slouch.  Hold the incentive spirometer in an upright position. INSTRUCTIONS FOR USE  1. Sit on the edge of your bed if possible, or sit up as far as you can in bed or on a chair. 2. Hold the incentive spirometer in an upright position. 3. Breathe out normally. 4. Place the mouthpiece in your mouth and seal your lips tightly around it. 5. Breathe in slowly and as deeply as possible, raising the piston or the ball toward the top of the column. 6. Hold your breath for 3-5 seconds or for as long as possible. Allow the piston or ball to fall to the bottom of the column. 7. Remove the mouthpiece from your mouth and breathe out normally. 8. Rest for a few seconds and repeat Steps 1 through 7 at least 10 times every 1-2 hours when you are awake. Take your time and take a few normal breaths  between deep breaths. 9. The spirometer may include an indicator to show your best effort. Use the indicator as a goal to work toward during each repetition. 10. After each set of 10 deep breaths, practice coughing to be sure your lungs are clear. If you have an incision (the cut made at the time of surgery), support your incision when coughing by placing a pillow or rolled up towels firmly against it. Once you are able to get out of bed, walk around indoors and cough well. You may stop using the incentive spirometer when instructed by your caregiver.  RISKS AND COMPLICATIONS  Take your time so you do not get dizzy or light-headed.  If you are in pain, you may need to take or ask for pain medication before doing incentive spirometry. It is harder to take a deep breath if you are having pain. AFTER USE  Rest and breathe slowly and easily.  It can be helpful to keep track of a log of your progress. Your caregiver can provide you with a simple table to help with this. If you are using the spirometer at home, follow these instructions: [place] IF:   You are having difficultly using the spirometer.  You have trouble using the spirometer as often as instructed.  Your pain medication is not giving enough relief while using the spirometer.  You develop fever of 100.5 F (38.1 C) or higher. SEEK IMMEDIATE MEDICAL CARE IF:   You cough up bloody sputum that had not been present before.  You develop fever of 102 F (38.9 C) or greater.  You develop worsening pain at or near the incision site. MAKE SURE YOU:   Understand these instructions.  Will watch your condition.  Will get help right away if you are not doing well or get worse. Document Released: 04/02/2007 Document  Revised: 02/12/2012 Document Reviewed: 06/03/2007 ExitCare Patient Information 2014 [place], [place].   ________________________________________________________________________

## 2020-03-02 ENCOUNTER — Encounter (HOSPITAL_COMMUNITY): Payer: Self-pay

## 2020-03-02 ENCOUNTER — Encounter (HOSPITAL_COMMUNITY)
Admission: RE | Admit: 2020-03-02 | Discharge: 2020-03-02 | Disposition: A | Discharge status: Home / Self Care | Payer: No Typology Code available for payment source | Source: Ambulatory Visit | Attending: General Surgery | Admitting: General Surgery

## 2020-03-02 ENCOUNTER — Other Ambulatory Visit: Payer: Self-pay

## 2020-03-02 DIAGNOSIS — Z01812 Encounter for preprocedural laboratory examination: Secondary | ICD-10-CM | POA: Diagnosis present

## 2020-03-03 ENCOUNTER — Encounter (HOSPITAL_COMMUNITY)
Admission: RE | Admit: 2020-03-03 | Discharge: 2020-03-03 | Disposition: A | Discharge status: Home / Self Care | Payer: No Typology Code available for payment source | Source: Ambulatory Visit | Attending: General Surgery | Admitting: General Surgery

## 2020-03-03 DIAGNOSIS — Z01812 Encounter for preprocedural laboratory examination: Secondary | ICD-10-CM | POA: Diagnosis not present

## 2020-03-03 LAB — COMPREHENSIVE METABOLIC PANEL
ALT: 20 U/L (ref 0–44)
AST: 16 U/L (ref 15–41)
Albumin: 3.8 g/dL (ref 3.5–5.0)
Alkaline Phosphatase: 40 U/L (ref 38–126)
Anion gap: 8 (ref 5–15)
BUN: 20 mg/dL (ref 6–20)
CO2: 23 mmol/L (ref 22–32)
Calcium: 9.3 mg/dL (ref 8.9–10.3)
Chloride: 105 mmol/L (ref 98–111)
Creatinine, Ser: 0.57 mg/dL (ref 0.44–1.00)
GFR calc Af Amer: >60 mL/min (ref >60)
GFR calc non Af Amer: >60 mL/min (ref >60)
Glucose, Bld: 93 mg/dL (ref 70–99)
Potassium: 4.2 mmol/L (ref 3.5–5.1)
Sodium: 136 mmol/L (ref 135–145)
Total Bilirubin: 0.5 mg/dL (ref 0.3–1.2)
Total Protein: 6.5 g/dL (ref 6.5–8.1)

## 2020-03-03 LAB — ABO/RH: ABO/RH(D): O POS

## 2020-03-03 LAB — CBC WITH DIFFERENTIAL/PLATELET
Abs Immature Granulocytes: 0.05 10*3/uL (ref 0.00–0.07)
Basophils Absolute: 0 10*3/uL (ref 0.0–0.1)
Basophils Relative: 0 %
Eosinophils Absolute: 0.2 10*3/uL (ref 0.0–0.5)
Eosinophils Relative: 1 %
HCT: 36.5 % (ref 36.0–46.0)
Hemoglobin: 11.6 g/dL — ABNORMAL LOW (ref 12.0–15.0)
Immature Granulocytes: 0 %
Lymphocytes Relative: 35 %
Lymphs Abs: 4.8 10*3/uL — ABNORMAL HIGH (ref 0.7–4.0)
MCH: 28.8 pg (ref 26.0–34.0)
MCHC: 31.8 g/dL (ref 30.0–36.0)
MCV: 90.6 fL (ref 80.0–100.0)
Monocytes Absolute: 0.9 10*3/uL (ref 0.1–1.0)
Monocytes Relative: 7 %
Neutro Abs: 7.8 10*3/uL — ABNORMAL HIGH (ref 1.7–7.7)
Neutrophils Relative %: 57 %
Platelets: 256 10*3/uL (ref 150–400)
RBC: 4.03 MIL/uL (ref 3.87–5.11)
RDW: 13.3 % (ref 11.5–15.5)
WBC: 13.7 10*3/uL — ABNORMAL HIGH (ref 4.0–10.5)
nRBC: 0 % (ref 0.0–0.2)

## 2020-03-04 ENCOUNTER — Other Ambulatory Visit (HOSPITAL_COMMUNITY)
Admission: RE | Admit: 2020-03-04 | Discharge: 2020-03-04 | Disposition: A | Discharge status: Home / Self Care | Payer: No Typology Code available for payment source | Source: Ambulatory Visit | Attending: General Surgery | Admitting: General Surgery

## 2020-03-04 DIAGNOSIS — Z20822 Contact with and (suspected) exposure to covid-19: Secondary | ICD-10-CM | POA: Diagnosis not present

## 2020-03-04 DIAGNOSIS — Z01812 Encounter for preprocedural laboratory examination: Secondary | ICD-10-CM | POA: Diagnosis present

## 2020-03-04 LAB — SARS CORONAVIRUS 2 (TAT 6-24 HRS): SARS Coronavirus 2: NEGATIVE

## 2020-03-07 MED ORDER — BUPIVACAINE LIPOSOME 1.3 % IJ SUSP
20.0000 mL | Freq: Once | INTRAMUSCULAR | Status: DC
  Filled 2020-03-07: qty 20

## 2020-03-08 ENCOUNTER — Inpatient Hospital Stay (HOSPITAL_COMMUNITY): Payer: No Typology Code available for payment source | Admitting: Certified Registered Nurse Anesthetist

## 2020-03-08 ENCOUNTER — Encounter (HOSPITAL_COMMUNITY): Payer: Self-pay | Admitting: General Surgery

## 2020-03-08 ENCOUNTER — Other Ambulatory Visit: Payer: Self-pay

## 2020-03-08 ENCOUNTER — Encounter (HOSPITAL_COMMUNITY)
Admission: RE | Disposition: A | Discharge status: Home / Self Care | Payer: Self-pay | Source: Home / Self Care | Attending: General Surgery

## 2020-03-08 ENCOUNTER — Inpatient Hospital Stay (HOSPITAL_COMMUNITY)
Admission: RE | Admit: 2020-03-08 | Discharge: 2020-03-09 | DRG: 621 | Disposition: A | Discharge status: Home / Self Care | Payer: No Typology Code available for payment source | Attending: General Surgery | Admitting: General Surgery

## 2020-03-08 DIAGNOSIS — Z8261 Family history of arthritis: Secondary | ICD-10-CM | POA: Diagnosis not present

## 2020-03-08 DIAGNOSIS — Z8249 Family history of ischemic heart disease and other diseases of the circulatory system: Secondary | ICD-10-CM | POA: Diagnosis not present

## 2020-03-08 DIAGNOSIS — Z8349 Family history of other endocrine, nutritional and metabolic diseases: Secondary | ICD-10-CM | POA: Diagnosis not present

## 2020-03-08 DIAGNOSIS — K2970 Gastritis, unspecified, without bleeding: Secondary | ICD-10-CM | POA: Diagnosis present

## 2020-03-08 DIAGNOSIS — Z6841 Body Mass Index (BMI) 40.0 and over, adult: Secondary | ICD-10-CM | POA: Diagnosis not present

## 2020-03-08 DIAGNOSIS — I10 Essential (primary) hypertension: Secondary | ICD-10-CM | POA: Diagnosis present

## 2020-03-08 DIAGNOSIS — K219 Gastro-esophageal reflux disease without esophagitis: Secondary | ICD-10-CM | POA: Diagnosis present

## 2020-03-08 DIAGNOSIS — Z833 Family history of diabetes mellitus: Secondary | ICD-10-CM | POA: Diagnosis not present

## 2020-03-08 DIAGNOSIS — Z809 Family history of malignant neoplasm, unspecified: Secondary | ICD-10-CM

## 2020-03-08 DIAGNOSIS — Z87891 Personal history of nicotine dependence: Secondary | ICD-10-CM | POA: Diagnosis not present

## 2020-03-08 DIAGNOSIS — E669 Obesity, unspecified: Secondary | ICD-10-CM | POA: Diagnosis present

## 2020-03-08 HISTORY — PX: LAPAROSCOPIC GASTRIC SLEEVE RESECTION: SHX5895

## 2020-03-08 LAB — TYPE AND SCREEN
ABO/RH(D): O POS
Antibody Screen: NEGATIVE

## 2020-03-08 LAB — SURGICAL PATHOLOGY

## 2020-03-08 LAB — HEMOGLOBIN AND HEMATOCRIT, BLOOD
HCT: 36.1 % (ref 36.0–46.0)
Hemoglobin: 11.7 g/dL — ABNORMAL LOW (ref 12.0–15.0)

## 2020-03-08 LAB — PREGNANCY, URINE: Preg Test, Ur: NEGATIVE

## 2020-03-08 SURGERY — GASTRECTOMY, SLEEVE, LAPAROSCOPIC
Anesthesia: General | Site: Abdomen

## 2020-03-08 MED ORDER — APREPITANT 40 MG PO CAPS
40.0000 mg | ORAL_CAPSULE | ORAL | Status: AC
  Administered 2020-03-08: 40 mg via ORAL
  Filled 2020-03-08: qty 1

## 2020-03-08 MED ORDER — DEXAMETHASONE SODIUM PHOSPHATE 10 MG/ML IJ SOLN
INTRAMUSCULAR | Status: AC
  Filled 2020-03-08: qty 1

## 2020-03-08 MED ORDER — FENTANYL CITRATE (PF) 100 MCG/2ML IJ SOLN
25.0000 ug | INTRAMUSCULAR | Status: DC | PRN
  Administered 2020-03-08 (×2): 50 ug via INTRAVENOUS

## 2020-03-08 MED ORDER — SUGAMMADEX SODIUM 200 MG/2ML IV SOLN
INTRAVENOUS | Status: DC | PRN
  Administered 2020-03-08: 300 mg via INTRAVENOUS

## 2020-03-08 MED ORDER — ONDANSETRON HCL 4 MG/2ML IJ SOLN
INTRAMUSCULAR | Status: AC
  Filled 2020-03-08: qty 2

## 2020-03-08 MED ORDER — MIDAZOLAM HCL 5 MG/5ML IJ SOLN
INTRAMUSCULAR | Status: DC | PRN
  Administered 2020-03-08: 2 mg via INTRAVENOUS

## 2020-03-08 MED ORDER — HEPARIN SODIUM (PORCINE) 5000 UNIT/ML IJ SOLN
5000.0000 [IU] | INTRAMUSCULAR | Status: AC
  Administered 2020-03-08: 5000 [IU] via SUBCUTANEOUS
  Filled 2020-03-08: qty 1

## 2020-03-08 MED ORDER — PROPOFOL 10 MG/ML IV BOLUS
INTRAVENOUS | Status: DC | PRN
  Administered 2020-03-08: 180 mg via INTRAVENOUS

## 2020-03-08 MED ORDER — SODIUM CHLORIDE 0.9 % IV SOLN
2.0000 g | INTRAVENOUS | Status: AC
  Administered 2020-03-08: 2 g via INTRAVENOUS
  Filled 2020-03-08: qty 2

## 2020-03-08 MED ORDER — CHLORHEXIDINE GLUCONATE CLOTH 2 % EX PADS: 6.0000 | MEDICATED_PAD | Freq: Once | CUTANEOUS | Status: DC

## 2020-03-08 MED ORDER — 0.9 % SODIUM CHLORIDE (POUR BTL) OPTIME
TOPICAL | Status: DC | PRN
  Administered 2020-03-08: 1000 mL

## 2020-03-08 MED ORDER — MIDAZOLAM HCL 2 MG/2ML IJ SOLN
INTRAMUSCULAR | Status: AC
  Filled 2020-03-08: qty 2

## 2020-03-08 MED ORDER — ONDANSETRON HCL 4 MG/2ML IJ SOLN
4.0000 mg | Freq: Four times a day (QID) | INTRAMUSCULAR | Status: AC | PRN
  Administered 2020-03-08: 4 mg via INTRAVENOUS

## 2020-03-08 MED ORDER — SIMETHICONE 80 MG PO CHEW
80.0000 mg | CHEWABLE_TABLET | Freq: Four times a day (QID) | ORAL | Status: DC | PRN
  Administered 2020-03-09: 80 mg via ORAL
  Filled 2020-03-08: qty 1

## 2020-03-08 MED ORDER — LACTATED RINGERS IV SOLN: INTRAVENOUS | Status: DC

## 2020-03-08 MED ORDER — SUGAMMADEX SODIUM 500 MG/5ML IV SOLN
INTRAVENOUS | Status: AC
  Filled 2020-03-08: qty 5

## 2020-03-08 MED ORDER — EPHEDRINE 5 MG/ML INJ
INTRAVENOUS | Status: AC
  Filled 2020-03-08: qty 10

## 2020-03-08 MED ORDER — FAMOTIDINE IN NACL 20-0.9 MG/50ML-% IV SOLN
20.0000 mg | Freq: Two times a day (BID) | INTRAVENOUS | Status: DC
  Administered 2020-03-08 – 2020-03-09 (×3): 20 mg via INTRAVENOUS
  Filled 2020-03-08 (×3): qty 50

## 2020-03-08 MED ORDER — SCOPOLAMINE 1 MG/3DAYS TD PT72
1.0000 | MEDICATED_PATCH | TRANSDERMAL | Status: DC
  Administered 2020-03-08: 1.5 mg via TRANSDERMAL
  Filled 2020-03-08: qty 1

## 2020-03-08 MED ORDER — FENTANYL CITRATE (PF) 100 MCG/2ML IJ SOLN
INTRAMUSCULAR | Status: AC
  Filled 2020-03-08: qty 2

## 2020-03-08 MED ORDER — LIDOCAINE 2% (20 MG/ML) 5 ML SYRINGE
INTRAMUSCULAR | Status: DC | PRN
  Administered 2020-03-08: 1.5 mg/kg/h via INTRAVENOUS

## 2020-03-08 MED ORDER — BUPIVACAINE HCL (PF) 0.25 % IJ SOLN
INTRAMUSCULAR | Status: DC | PRN
  Administered 2020-03-08: 30 mL

## 2020-03-08 MED ORDER — ROCURONIUM BROMIDE 10 MG/ML (PF) SYRINGE
PREFILLED_SYRINGE | INTRAVENOUS | Status: AC
  Filled 2020-03-08: qty 10

## 2020-03-08 MED ORDER — BUPIVACAINE HCL 0.25 % IJ SOLN
INTRAMUSCULAR | Status: AC
  Filled 2020-03-08: qty 1

## 2020-03-08 MED ORDER — ACETAMINOPHEN 160 MG/5ML PO SOLN
1000.0000 mg | Freq: Three times a day (TID) | ORAL | Status: DC
  Administered 2020-03-08: 1000 mg via ORAL
  Filled 2020-03-08: qty 40.6

## 2020-03-08 MED ORDER — ONDANSETRON HCL 4 MG/2ML IJ SOLN
INTRAMUSCULAR | Status: DC | PRN
  Administered 2020-03-08: 4 mg via INTRAVENOUS

## 2020-03-08 MED ORDER — EPHEDRINE SULFATE-NACL 50-0.9 MG/10ML-% IV SOSY
PREFILLED_SYRINGE | INTRAVENOUS | Status: DC | PRN
  Administered 2020-03-08 (×2): 10 mg via INTRAVENOUS

## 2020-03-08 MED ORDER — LACTATED RINGERS IR SOLN
Status: DC | PRN
  Administered 2020-03-08: 1000 mL

## 2020-03-08 MED ORDER — LIDOCAINE 2% (20 MG/ML) 5 ML SYRINGE
INTRAMUSCULAR | Status: DC | PRN
  Administered 2020-03-08: 100 mg via INTRAVENOUS

## 2020-03-08 MED ORDER — ENSURE MAX PROTEIN PO LIQD
2.0000 [oz_av] | ORAL | Status: DC
  Administered 2020-03-09 (×3): 2 [oz_av] via ORAL

## 2020-03-08 MED ORDER — LIDOCAINE 2% (20 MG/ML) 5 ML SYRINGE
INTRAMUSCULAR | Status: AC
  Filled 2020-03-08: qty 5

## 2020-03-08 MED ORDER — KETAMINE HCL 10 MG/ML IJ SOLN
INTRAMUSCULAR | Status: AC
  Filled 2020-03-08: qty 1

## 2020-03-08 MED ORDER — OXYCODONE HCL 5 MG PO TABS: 5.0000 mg | ORAL_TABLET | Freq: Once | ORAL | Status: DC | PRN

## 2020-03-08 MED ORDER — OXYCODONE HCL 5 MG/5ML PO SOLN: 5.0000 mg | Freq: Once | ORAL | Status: DC | PRN

## 2020-03-08 MED ORDER — KETAMINE HCL 10 MG/ML IJ SOLN
INTRAMUSCULAR | Status: DC | PRN
  Administered 2020-03-08: 30 mg via INTRAVENOUS

## 2020-03-08 MED ORDER — SUCCINYLCHOLINE CHLORIDE 200 MG/10ML IV SOSY
PREFILLED_SYRINGE | INTRAVENOUS | Status: AC
  Filled 2020-03-08: qty 10

## 2020-03-08 MED ORDER — ENOXAPARIN SODIUM 30 MG/0.3ML ~~LOC~~ SOLN
30.0000 mg | Freq: Two times a day (BID) | SUBCUTANEOUS | Status: DC
  Administered 2020-03-08 – 2020-03-09 (×2): 30 mg via SUBCUTANEOUS
  Filled 2020-03-08 (×2): qty 0.3

## 2020-03-08 MED ORDER — PROPOFOL 10 MG/ML IV BOLUS
INTRAVENOUS | Status: AC
  Filled 2020-03-08: qty 20

## 2020-03-08 MED ORDER — LIDOCAINE HCL 2 % IJ SOLN
INTRAMUSCULAR | Status: AC
  Filled 2020-03-08: qty 20

## 2020-03-08 MED ORDER — FENTANYL CITRATE (PF) 250 MCG/5ML IJ SOLN
INTRAMUSCULAR | Status: DC | PRN
  Administered 2020-03-08: 100 ug via INTRAVENOUS
  Administered 2020-03-08: 50 ug via INTRAVENOUS

## 2020-03-08 MED ORDER — ACETAMINOPHEN 500 MG PO TABS
1000.0000 mg | ORAL_TABLET | Freq: Three times a day (TID) | ORAL | Status: DC
  Administered 2020-03-08 – 2020-03-09 (×2): 1000 mg via ORAL
  Filled 2020-03-08 (×2): qty 2

## 2020-03-08 MED ORDER — BUPIVACAINE LIPOSOME 1.3 % IJ SUSP
INTRAMUSCULAR | Status: DC | PRN
  Administered 2020-03-08: 20 mL

## 2020-03-08 MED ORDER — OXYCODONE HCL 5 MG/5ML PO SOLN
5.0000 mg | Freq: Four times a day (QID) | ORAL | Status: DC | PRN
  Administered 2020-03-08 – 2020-03-09 (×2): 5 mg via ORAL
  Filled 2020-03-08 (×2): qty 5

## 2020-03-08 MED ORDER — MORPHINE SULFATE (PF) 2 MG/ML IV SOLN
1.0000 mg | INTRAVENOUS | Status: DC | PRN
  Administered 2020-03-08: 2 mg via INTRAVENOUS
  Filled 2020-03-08: qty 1

## 2020-03-08 MED ORDER — ROCURONIUM BROMIDE 50 MG/5ML IV SOSY
PREFILLED_SYRINGE | INTRAVENOUS | Status: DC | PRN
  Administered 2020-03-08 (×3): 10 mg via INTRAVENOUS
  Administered 2020-03-08: 50 mg via INTRAVENOUS

## 2020-03-08 MED ORDER — DEXAMETHASONE SODIUM PHOSPHATE 10 MG/ML IJ SOLN
INTRAMUSCULAR | Status: DC | PRN
  Administered 2020-03-08: 6 mg via INTRAVENOUS

## 2020-03-08 MED ORDER — DEXAMETHASONE SODIUM PHOSPHATE 4 MG/ML IJ SOLN: 4.0000 mg | INTRAMUSCULAR | Status: DC

## 2020-03-08 MED ORDER — FENTANYL CITRATE (PF) 250 MCG/5ML IJ SOLN
INTRAMUSCULAR | Status: AC
  Filled 2020-03-08: qty 5

## 2020-03-08 MED ORDER — ACETAMINOPHEN 500 MG PO TABS
1000.0000 mg | ORAL_TABLET | ORAL | Status: AC
  Administered 2020-03-08: 1000 mg via ORAL
  Filled 2020-03-08: qty 2

## 2020-03-08 MED ORDER — GABAPENTIN 300 MG PO CAPS
300.0000 mg | ORAL_CAPSULE | ORAL | Status: AC
  Administered 2020-03-08: 300 mg via ORAL
  Filled 2020-03-08: qty 1

## 2020-03-08 MED ORDER — ONDANSETRON HCL 4 MG/2ML IJ SOLN
4.0000 mg | INTRAMUSCULAR | Status: DC | PRN
  Administered 2020-03-08: 4 mg via INTRAVENOUS
  Filled 2020-03-08: qty 2

## 2020-03-08 MED ORDER — PHENYLEPHRINE HCL-NACL 10-0.9 MG/250ML-% IV SOLN
INTRAVENOUS | Status: DC | PRN
  Administered 2020-03-08: 35 ug/min via INTRAVENOUS

## 2020-03-08 MED ORDER — PHENYLEPHRINE HCL (PRESSORS) 10 MG/ML IV SOLN
INTRAVENOUS | Status: AC
  Filled 2020-03-08: qty 1

## 2020-03-08 MED ORDER — DEXTROSE-NACL 5-0.45 % IV SOLN: INTRAVENOUS | Status: DC

## 2020-03-08 SURGICAL SUPPLY — 53 items
APPLIER CLIP ROT 13.4 12 LRG (CLIP)
BAG LAPAROSCOPIC 12 15 PORT 16 (BASKET) ×1 IMPLANT
BAG RETRIEVAL 12/15 (BASKET) ×2
BENZOIN TINCTURE PRP APPL 2/3 (GAUZE/BANDAGES/DRESSINGS) ×2 IMPLANT
BLADE SURG SZ11 CARB STEEL (BLADE) ×2 IMPLANT
BNDG ADH 1X3 SHEER STRL LF (GAUZE/BANDAGES/DRESSINGS) ×2 IMPLANT
CABLE HIGH FREQUENCY MONO STRZ (ELECTRODE) IMPLANT
CHLORAPREP W/TINT 26 (MISCELLANEOUS) ×2 IMPLANT
CLIP APPLIE ROT 13.4 12 LRG (CLIP) IMPLANT
COVER SURGICAL LIGHT HANDLE (MISCELLANEOUS) ×2 IMPLANT
COVER WAND RF STERILE (DRAPES) IMPLANT
DECANTER SPIKE VIAL GLASS SM (MISCELLANEOUS) ×2 IMPLANT
DRAPE UTILITY XL STRL (DRAPES) ×4 IMPLANT
ELECT REM PT RETURN 15FT ADLT (MISCELLANEOUS) ×2 IMPLANT
GAUZE 4X4 16PLY RFD (DISPOSABLE) ×2 IMPLANT
GLOVE BIOGEL PI IND STRL 7.0 (GLOVE) ×1 IMPLANT
GLOVE BIOGEL PI INDICATOR 7.0 (GLOVE) ×1
GLOVE SURG SS PI 7.0 STRL IVOR (GLOVE) ×2 IMPLANT
GOWN STRL REUS W/TWL LRG LVL3 (GOWN DISPOSABLE) ×2 IMPLANT
GOWN STRL REUS W/TWL XL LVL3 (GOWN DISPOSABLE) ×6 IMPLANT
GRASPER SUT TROCAR 14GX15 (MISCELLANEOUS) ×2 IMPLANT
HOVERMATT SINGLE USE (MISCELLANEOUS) IMPLANT
KIT BASIN OR (CUSTOM PROCEDURE TRAY) ×2 IMPLANT
KIT TURNOVER KIT A (KITS) IMPLANT
MARKER SKIN DUAL TIP RULER LAB (MISCELLANEOUS) ×2 IMPLANT
NEEDLE SPNL 22GX3.5 QUINCKE BK (NEEDLE) ×2 IMPLANT
PACK UNIVERSAL I (CUSTOM PROCEDURE TRAY) ×2 IMPLANT
RELOAD STAPLER BLUE 60MM (STAPLE) ×4 IMPLANT
RELOAD STAPLER GOLD 60MM (STAPLE) ×1 IMPLANT
RELOAD STAPLER GREEN 60MM (STAPLE) ×1 IMPLANT
SCISSORS LAP 5X45 EPIX DISP (ENDOMECHANICALS) IMPLANT
SET IRRIG TUBING LAPAROSCOPIC (IRRIGATION / IRRIGATOR) ×2 IMPLANT
SET TUBE SMOKE EVAC HIGH FLOW (TUBING) ×2 IMPLANT
SHEARS HARMONIC ACE PLUS 45CM (MISCELLANEOUS) ×2 IMPLANT
SLEEVE GASTRECTOMY 40FR VISIGI (MISCELLANEOUS) ×2 IMPLANT
SLEEVE XCEL OPT CAN 5 100 (ENDOMECHANICALS) ×4 IMPLANT
SOL ANTI FOG 6CC (MISCELLANEOUS) ×1 IMPLANT
SOLUTION ANTI FOG 6CC (MISCELLANEOUS) ×1
STAPLER ECHELON LONG 60 440 (INSTRUMENTS) ×2 IMPLANT
STAPLER RELOAD BLUE 60MM (STAPLE) ×8
STAPLER RELOAD GOLD 60MM (STAPLE) ×2
STAPLER RELOAD GREEN 60MM (STAPLE) ×2
STRIP CLOSURE SKIN 1/2X4 (GAUZE/BANDAGES/DRESSINGS) ×2 IMPLANT
SUT ETHIBOND 0 36 GRN (SUTURE) IMPLANT
SUT MNCRL AB 4-0 PS2 18 (SUTURE) ×2 IMPLANT
SUT VICRYL 0 TIES 12 18 (SUTURE) ×2 IMPLANT
SYR 20ML LL LF (SYRINGE) ×2 IMPLANT
SYR 50ML LL SCALE MARK (SYRINGE) ×2 IMPLANT
TOWEL OR 17X26 10 PK STRL BLUE (TOWEL DISPOSABLE) ×2 IMPLANT
TOWEL OR NON WOVEN STRL DISP B (DISPOSABLE) ×2 IMPLANT
TROCAR BLADELESS 15MM (ENDOMECHANICALS) ×2 IMPLANT
TROCAR BLADELESS OPT 5 100 (ENDOMECHANICALS) ×2 IMPLANT
TUBING CONNECTING 10 (TUBING) ×4 IMPLANT

## 2020-03-08 NOTE — Progress Notes (Signed)
Pt has voided, ambulated, is using incentive spirometer, and her vitals are stable. Started first 2oz cup of water at [employer].

## 2020-03-08 NOTE — Anesthesia Preprocedure Evaluation (Signed)
Anesthesia Evaluation  Patient identified by MRN, date of birth, ID band Patient awake    Reviewed: Allergy & Precautions, H&P , NPO status , Patient's Chart, lab work & pertinent test results  Airway Mallampati: II   Neck ROM: full    Dental   Pulmonary former smoker,    breath sounds clear to auscultation       Cardiovascular hypertension,  Rhythm:regular Rate:Normal     Neuro/Psych    GI/Hepatic GERD  ,  Endo/Other  Morbid obesity  Renal/GU      Musculoskeletal   Abdominal   Peds  Hematology   Anesthesia Other Findings   Reproductive/Obstetrics                             Anesthesia Physical Anesthesia Plan  ASA: II  Anesthesia Plan: General   Post-op Pain Management:    Induction: Intravenous  PONV Risk Score and Plan: 3 and Ondansetron, Dexamethasone, Midazolam and Treatment may vary due to age or medical condition  Airway Management Planned: Oral ETT  Additional Equipment:   Intra-op Plan:   Post-operative Plan: Extubation in OR  Informed Consent: I have reviewed the patients History and Physical, chart, labs and discussed the procedure including the risks, benefits and alternatives for the proposed anesthesia with the patient or authorized representative who has indicated his/her understanding and acceptance.       Plan Discussed with: CRNA, Anesthesiologist and Surgeon  Anesthesia Plan Comments:         Anesthesia Quick Evaluation

## 2020-03-08 NOTE — Op Note (Signed)
Preop Diagnosis: Obesity Class III  Postop Diagnosis: same  Procedure performed: laparoscopic Sleeve Gastrectomy  Assitant: Kaylyn Lim  Indications:  The patient is a 34 y.o. year-old morbidly obese female who has been followed in the Bariatric Clinic as an outpatient. This patient was diagnosed with morbid obesity with a BMI of Body mass index is 46.66 kg/m. and significant co-morbidities including hypertension.  The patient was counseled extensively in the Bariatric Outpatient Clinic and after a thorough explanation of the risks and benefits of surgery (including death from complications, bowel leak, infection such as peritonitis and/or sepsis, internal hernia, bleeding, need for blood transfusion, bowel obstruction, organ failure, pulmonary embolus, deep venous thrombosis, wound infection, incisional hernia, skin breakdown, and others entailed on the consent form) and after a compliant diet and exercise program, the patient was scheduled for an elective laparoscopic sleeve gastrectomy.  Description of Operation:  Following informed consent, the patient was taken to the operating room and placed on the operating table in the supine position.  She had previously received prophylactic antibiotics and subcutaneous heparin for DVT prophylaxis in the pre-op holding area.  After induction of general endotracheal anesthesia by the anesthesiologist, the patient underwent placement of sequential compression devices and an oro-gastric tube.  A timeout was confirmed by the surgery and anesthesia teams.  The patient was adequately padded at all pressure points and placed on a footboard to prevent slippage from the OR table during extremes of position during surgery.  She underwent a routine sterile prep and drape of her entire abdomen.    Next, A transverse incision was made under the left subcostal area and a 20mm optical viewing trocar was introduced into the peritoneal cavity. Pneumoperitoneum was applied  with a high flow and low pressure. A laparoscope was inserted to confirm placement. A extraperitoneal block was then placed at the lateral abdominal wall using exparel diluted with marcaine. 5 additional incisions were placed: 1 22mm trocar to the left of the midline. 1 additional 62mm trocar in the left lateral area, 1 90mm trocar in the right mid abdomen, 1 62mm trocar in the right subcostal area, and a Nathanson retractor was placed through a subxiphoid incision.  The fat pad at the GE junction was incised and the gastrodiaphragmatic ligament was divided using the Harmonic scalpel. Next, a hole was created through the lesser omentum along the greater curve of the stomach to enter the lesser sac. The vessels along the greater omentum were  Then ligated and divided using the Harmonic scalpel moving towards the spleen and then short gastric vessels were ligated and divided in the same fashion to fully mobilize the fundus. The left crus was identified to ensure completion of the dissection. Next the antrum was measured and dissection continued inferiorly along the greater curve towards the pylorus and stopped 6cm from the pylorus.   A 40Fr ViSiGi dilator was placed into the esophgaus and along the lesser curve of the stomach and placed on suction. 1 non-reinforced 80mm Green load echelon stapler(s) followed by 1 59mm Gold load echelon stapler(s) followed by 4 60mm blue load echelon stapler(s) were used to make the resection along the antrum being sure to stay well away from the angularis by angling the jaws of the stapler towards the greater curve and later completing the resection staying along the Sequoyah and ensuring the fundus was not retained by appropriately retracting it lateral. Air was inserted through the Atlantis to perform a leak test showing no bubbles and a neutral lie  of the stomach.  The assistant then went and performed an upper endoscopy and leak test. No bubbles were seen and the sleeve and antrum  distended appropriately. The antrum showed some gastritis and a polyp at the pylorus. The specimen was then placed in an endocatch bag and removed by the 89mm port. The fascia of the 21mm port was closed with a 0 vicryl by suture passer. Hemostasis was ensured. Pneumoperitoneum was evacuated, all ports were removed and all incisions closed with 4-0 monocryl suture in subcuticular fashion. Steristrips and bandaids were put in place for dressing. The patient awoke from anesthesia and was brought to pacu in stable condition. All counts were correct.  Estimated blood loss: <2ml  Specimens:  Sleeve gastrectomy  Local Anesthesia: 50 ml Exparel:0.5% Marcaine mix  Post-Op Plan:       Pain Management: PO, prn      Antibiotics: Prophylactic      Anticoagulation: Prophylactic, Starting now      Post Op Studies/Consults: Not applicable      Intended Discharge: within 48h      Intended Outpatient Follow-Up: Two Week      Intended Outpatient Studies: Not Applicable      Other: Not Applicable  Images:     De Blanch Keyden Pavlov

## 2020-03-08 NOTE — Discharge Instructions (Signed)
° ° ° °GASTRIC BYPASS/SLEEVE ° Home Care Instructions ° ° These instructions are to help you care for yourself when you go home. ° °Call: If you have any problems. °• Call [phone] and ask for the surgeon on call °• If you need immediate help, come to the ER at .  °• Tell the ER staff that you are a new post-op gastric bypass or gastric sleeve patient °  °Signs and symptoms to report: • Severe vomiting or nausea °o If you cannot keep down clear liquids for longer than 1 day, call your surgeon  °• Abdominal pain that does not get better after taking your pain medication °• Fever over 100.4° F with chills °• Heart beating over 100 beats a minute °• Shortness of breath at rest °• Chest pain °•  Redness, swelling, drainage, or foul odor at incision (surgical) sites °•  If your incisions open or pull apart °• Swelling or pain in calf (lower leg) °• Diarrhea (Loose bowel movements that happen often), frequent watery, uncontrolled bowel movements °• Constipation, (no bowel movements for 3 days) if this happens: Pick one °o Milk of Magnesia, 2 tablespoons by mouth, 3 times a day for 2 days if needed °o Stop taking Milk of Magnesia once you have a bowel movement °o Call your doctor if constipation continues °Or °o Miralax  (instead of Milk of Magnesia) following the label instructions °o Stop taking Miralax once you have a bowel movement °o Call your doctor if constipation continues °• Anything you think is not normal °  °Normal side effects after surgery: • Unable to sleep at night or unable to focus °• Irritability or moody °• Being tearful (crying) or depressed °These are common complaints, possibly related to your anesthesia medications that put you to sleep, stress of surgery, and change in lifestyle.  This usually goes away a few weeks after surgery.  If these feelings continue, call your primary care doctor. °  °Wound Care: You may have surgical glue, steri-strips, or staples over your incisions after  surgery °• Surgical glue:  Looks like a clear film over your incisions and will wear off a little at a time °• Steri-strips: Strips of tape over your incisions. You may notice a yellowish color on the skin under the steri-strips. This is used to make the   steri-strips stick better. Do not pull the steri-strips off - let them fall off °• Staples: Staples may be removed before you leave the hospital °o If you go home with staples, call [facility], [phone] at for an appointment with your surgeon’s nurse to have staples removed 10 days after surgery. °• Showering: You may shower two (2) days after your surgery unless your surgeon tells you differently °o Wash gently around incisions with warm soapy water, rinse well, and gently pat dry  °o No tub baths until staples are removed, steri-strips fall off or glue is gone.  °  °Medications: • Medications should be liquid or crushed if larger than the size of a dime °• Extended release pills (medication that release a little bit at a time through the day) should NOT be crushed or cut. (examples include XL, ER, DR, SR) °• Depending on the size and number of medications you take, you may need to space (take a few throughout the day)/change the time you take your medications so that you do not over-fill your pouch (smaller stomach) °• Make sure you follow-up with your primary care doctor to   make medication changes needed during rapid weight loss and life-style changes °• If you have diabetes, follow up with the doctor that orders your diabetes medication(s) within one week after surgery and check your blood sugar regularly. °• Do not drive while taking prescription pain medication  °• It is ok to take Tylenol by the bottle instructions with your pain medicine or instead of your pain medicine as needed.  DO NOT TAKE NSAIDS (EXAMPLES OF NSAIDS:  IBUPROFREN/ NAPROXEN)  °Diet:                    First 2 Weeks ° You will see the dietician t about two (2) weeks  after your surgery. The dietician will increase the types of foods you can eat if you are handling liquids well: °• If you have severe vomiting or nausea and cannot keep down clear liquids lasting longer than 1 day, call your surgeon @ ([phone]) °Protein Shake °• Drink at least 2 ounces of shake 5-6 times per day °• Each serving of protein shakes (usually 8 - 12 ounces) should have: °o 15 grams of protein  °o And no more than 5 grams of carbohydrate  °• Goal for protein each day: °o Men = 80 grams per day °o Women = 60 grams per day °• Protein powder may be added to fluids such as non-fat milk or Lactaid milk or unsweetened Soy/Almond milk (limit to 35 grams added protein powder per serving) ° °Hydration °• Slowly increase the amount of water and other clear liquids as tolerated (See Acceptable Fluids) °• Slowly increase the amount of protein shake as tolerated  °•  Sip fluids slowly and throughout the day.  Do not use straws. °• May use sugar substitutes in small amounts (no more than 6 - 8 packets per day; i.e. Splenda) ° °Fluid Goal °• The first goal is to drink at least 8 ounces of protein shake/drink per day (or as directed by the nutritionist); some examples of protein shakes are Syntrax Nectar, Adkins Advantage, EAS Edge HP, and Unjury. See handout from pre-op Bariatric Education Class: °o Slowly increase the amount of protein shake you drink as tolerated °o You may find it easier to slowly sip shakes throughout the day °o It is important to get your proteins in first °• Your fluid goal is to drink 64 - 100 ounces of fluid daily °o It may take a few weeks to build up to this °• 32 oz (or more) should be clear liquids  °And  °• 32 oz (or more) should be full liquids (see below for examples) °• Liquids should not contain sugar, caffeine, or carbonation ° °Clear Liquids: °• Water or Sugar-free flavored water (i.e. Fruit H2O, Propel) °• Decaffeinated coffee or tea (sugar-free) °• Crystal Lite, Wyler’s Lite,  Minute Maid Lite °• Sugar-free Jell-O °• Bouillon or broth °• Sugar-free Popsicle:   *Less than 20 calories each; Limit 1 per day ° °Full Liquids: °Protein Shakes/Drinks + 2 choices per day of other full liquids °• Full liquids must be: °o No More Than 15 grams of Carbs per serving  °o No More Than 3 grams of Fat per serving °• Strained low-fat cream soup (except Cream of Potato or Tomato) °• Non-Fat milk °• Fat-free Lactaid Milk °• Unsweetened Soy Or Unsweetened Almond Milk °• Low Sugar yogurt (Dannon Lite & Fit, Greek yogurt; Oikos Triple Zero; Chobani Simply 100; Yoplait 100 calorie Greek - No Fruit on the Bottom) ° °  °Vitamins   and Minerals • Start 1 day after surgery unless otherwise directed by your surgeon °• 2 Chewable Bariatric Specific Multivitamin / Multimineral Supplement with iron (Example: Bariatric Advantage Multi EA) °• Chewable Calcium with Vitamin D-3 °(Example: 3 Chewable Calcium Plus 600 with Vitamin D-3) °o Take 500 mg three (3) times a day for a total of 1500 mg each day °o Do not take all 3 doses of calcium at one time as it may cause constipation, and you can only absorb 500 mg  at a time  °o Do not mix multivitamins containing iron with calcium supplements; take 2 hours apart °• Menstruating women and those with a history of anemia (a blood disease that causes weakness) may need extra iron °o Talk with your doctor to see if you need more iron °• Do not stop taking or change any vitamins or minerals until you talk to your dietitian or surgeon °• Your Dietitian and/or surgeon must approve all vitamin and mineral supplements °  °Activity and Exercise: Limit your physical activity as instructed by your doctor.  It is important to continue walking at home.  During this time, use these guidelines: °• Do not lift anything greater than ten (10) pounds for at least two (2) weeks °• Do not go back to work or drive until your surgeon says you can °• You may have sex when you feel comfortable  °o It is  VERY important for female patients to use a reliable birth control method; fertility often increases after surgery  °o All hormonal birth control will be ineffective for 30 days after surgery due to medications given during surgery a barrier method must be used. °o Do not get pregnant for at least 18 months °• Start exercising as soon as your doctor tells you that you can °o Make sure your doctor approves any physical activity °• Start with a simple walking program °• Walk 5-15 minutes each day, 7 days per week.  °• Slowly increase until you are walking 30-45 minutes per day °Consider joining our BELT program. [phone] or email [email or web address] °  °Special Instructions Things to remember: °• Use your CPAP when sleeping if this applies to you ° °• [facility] has two free Bariatric Surgery Support Groups that meet monthly °o The 3rd Thursday of each month, 6 pm, [facility]  °o The 2nd Friday of each month, 11:45 am in the private dining room in the basement of [facility] °• It is very important to keep all follow up appointments with your surgeon, dietitian, primary care physician, and behavioral health practitioner °• Routine follow up schedule with your surgeon include appointments at 2-3 weeks, 6-8 weeks, 6 months, and 1 year at a minimum.  Your surgeon may request to see you more often.   °o After the first year, please follow up with your bariatric surgeon and dietitian at least once a year in order to maintain best weight loss results °[facility]: [phone] °[facility]: [phone] °Bariatric Nurse Coordinator: [phone] °  °   Reviewed and Endorsed  °by [facility] Patient Education Committee, June, 2016 °Edits Approved: Aug, 2018 ° ° ° °

## 2020-03-08 NOTE — H&P (Signed)
Kathryn Harvey is an 34 y.o. female.   Chief Complaint: obesity HPI: 34 yo female with long history of obesity and hypertension. She has completed the pathway and is ready to proceed to bariatric surgery.  Past Medical History:  Diagnosis Date  . GERD (gastroesophageal reflux disease)   . Hypertension     Past Surgical History:  Procedure Laterality Date  . NO PAST SURGERIES      Family History  Problem Relation Age of Onset  . Arthritis Mother   . Cancer Mother   . Hypertension Mother   . Diabetes Sister   . Thyroid disease Sister   . Hypertension Sister    Social History:  reports that she quit smoking about 8 months ago. Her smoking use included cigars. She quit after 2.00 years of use. She has never used smokeless tobacco. She reports that she does not drink alcohol or use drugs.  Allergies:  Allergies  Allergen Reactions  . Sulfa Antibiotics Swelling    Medications Prior to Admission  Medication Sig Dispense Refill  . CALCIUM CITRATE PO Take 1 tablet by mouth in the morning, at noon, and at bedtime.    . ferrous sulfate 325 (65 FE) MG tablet Take 325 mg by mouth daily with breakfast.    . Levonorgestrel (SKYLA) 13.5 MG IUD 13.5 mg by Intrauterine route daily.    Marland Kitchen losartan (COZAAR) 50 MG tablet Take 50 mg by mouth daily.    . Multiple Vitamins-Minerals (BARIATRIC MULTIVITAMINS/IRON PO) Take 1 tablet by mouth in the morning and at bedtime.    Marland Kitchen amoxicillin (AMOXIL) 500 MG capsule Take 1 capsule (500 mg total) by mouth 3 (three) times daily. (Patient not taking: Reported on 02/25/2020) 21 capsule 0  . fluconazole (DIFLUCAN) 150 MG tablet Take 1 tablet (150 mg total) by mouth once. Repeat if needed (Patient not taking: Reported on 01/07/2016) 2 tablet 0  . nystatin-triamcinolone ointment (MYCOLOG) Apply 1 application topically 2 (two) times daily. (Patient not taking: Reported on 01/07/2016) 30 g 0  . omeprazole (PRILOSEC) 20 MG capsule Take 1 capsule (20 mg total) by mouth 2  (two) times daily before a meal. (Patient not taking: Reported on 02/25/2020) 60 capsule 0    Results for orders placed or performed during the hospital encounter of 03/08/20 (from the past 48 hour(s))  Pregnancy, urine STAT morning of surgery     Status: None   Collection Time: 03/08/20  5:41 AM  Result Value Ref Range   Preg Test, Ur NEGATIVE NEGATIVE    Comment:        THE SENSITIVITY OF THIS METHODOLOGY IS >20 mIU/mL. Performed at [facility], [place] [address], [place], [place] [address]    No results found.  Review of Systems  Constitutional: Negative for chills and fever.  HENT: Negative for hearing loss.   Respiratory: Negative for cough.   Cardiovascular: Negative for chest pain and palpitations.  Gastrointestinal: Negative for abdominal pain, nausea and vomiting.  Genitourinary: Negative for dysuria and urgency.  Musculoskeletal: Negative for myalgias and neck pain.  Skin: Negative for rash.  Neurological: Negative for dizziness and headaches.  Hematological: Does not bruise/bleed easily.  Psychiatric/Behavioral: Negative for suicidal ideas.    Blood pressure 119/72, pulse 73, temperature 98.7 F (37.1 C), temperature source Oral, resp. rate 16, height 5\' 5"  (1.651 m), weight 127.2 kg, last menstrual period 03/08/2020, SpO2 100 %. Physical Exam  Nursing note and vitals reviewed. Constitutional: She is oriented to person, place,  and time. She appears well-developed and well-nourished.  HENT:  Head: Normocephalic and atraumatic.  Eyes: Conjunctivae and EOM are normal. No scleral icterus.  Cardiovascular: Normal rate and regular rhythm.  Respiratory: Effort normal and breath sounds normal. She has no wheezes. She has no rales. She exhibits no tenderness.  GI: Soft. She exhibits no distension. There is no abdominal tenderness. There is no rebound.  Musculoskeletal:        General: No edema. Normal range of motion.     Cervical back: Normal range of  motion and neck supple.  Neurological: She is alert and oriented to person, place, and time.  Skin: Skin is warm and dry.  Psychiatric: She has a normal mood and affect. Her behavior is normal.     Assessment/Plan 34 yo female with class III obesity -lap sleeve gastrectomy -ERAS and bariatric protocols  Rodman Pickle, MD 03/08/2020, 7:07 AM

## 2020-03-08 NOTE — Progress Notes (Signed)
PHARMACY CONSULT FOR:  Risk Assessment for Post-Discharge VTE Following Bariatric Surgery  Post-Discharge VTE Risk Assessment: This patient's probability of 30-day post-discharge VTE is increased due to the factors marked:   Female    Age >/=60 years    BMI >/=50 kg/m2    CHF    Dyspnea at Rest    Paraplegia  x  Non-gastric-band surgery    Operation Time >/=3 hr    Return to OR     Length of Stay >/= 3 d      Hx of VTE   Hypercoagulable condition   Significant venous stasis   Predicted probability of 30-day post-discharge VTE: 0.16%  Other patient-specific factors to consider:   Recommendation for Discharge: No pharmacologic prophylaxis post-discharge  Kathryn Harvey is a 34 y.o. female who underwent sleeve gastrectomy on 03/08/2020   Case start: 0747 Case end: 0854   Allergies  Allergen Reactions  . Sulfa Antibiotics Swelling    Patient Measurements: Height: 5\' 5"  (165.1 cm) Weight: 127.2 kg (280 lb 6.4 oz) IBW/kg (Calculated) : 57 Body mass index is 46.66 kg/m.  No results for input(s): WBC, HGB, HCT, PLT, APTT, CREATININE, LABCREA, CREATININE, CREAT24HRUR, MG, PHOS, ALBUMIN, PROT, ALBUMIN, AST, ALT, ALKPHOS, BILITOT, BILIDIR, IBILI in the last 72 hours. Estimated Creatinine Clearance: 134.4 mL/min (by C-G formula based on SCr of 0.57 mg/dL).    Past Medical History:  Diagnosis Date  . GERD (gastroesophageal reflux disease)   . Hypertension      Medications Prior to Admission  Medication Sig Dispense Refill Last Dose  . CALCIUM CITRATE PO Take 1 tablet by mouth in the morning, at noon, and at bedtime.   03/07/2020 at Unknown time  . ferrous sulfate 325 (65 FE) MG tablet Take 325 mg by mouth daily with breakfast.   03/07/2020 at Unknown time  . Levonorgestrel (SKYLA) 13.5 MG IUD 13.5 mg by Intrauterine route daily.     05/07/2020 losartan (COZAAR) 50 MG tablet Take 50 mg by mouth daily.   03/07/2020 at Unknown time  . Multiple Vitamins-Minerals (BARIATRIC  MULTIVITAMINS/IRON PO) Take 1 tablet by mouth in the morning and at bedtime.   03/07/2020 at Unknown time  . amoxicillin (AMOXIL) 500 MG capsule Take 1 capsule (500 mg total) by mouth 3 (three) times daily. (Patient not taking: Reported on 02/25/2020) 21 capsule 0 Not Taking at Unknown time  . fluconazole (DIFLUCAN) 150 MG tablet Take 1 tablet (150 mg total) by mouth once. Repeat if needed (Patient not taking: Reported on 01/07/2016) 2 tablet 0   . nystatin-triamcinolone ointment (MYCOLOG) Apply 1 application topically 2 (two) times daily. (Patient not taking: Reported on 01/07/2016) 30 g 0   . omeprazole (PRILOSEC) 20 MG capsule Take 1 capsule (20 mg total) by mouth 2 (two) times daily before a meal. (Patient not taking: Reported on 02/25/2020) 60 capsule 0 Not Taking at Unknown time     02/27/2020, PharmD, BCPS Pharmacy: [phone] 03/08/2020,9:43 AM

## 2020-03-08 NOTE — Anesthesia Procedure Notes (Signed)
Procedure Name: Intubation Date/Time: 03/08/2020 7:29 AM Performed by: Maxwell Caul, CRNA Pre-anesthesia Checklist: Patient identified, Emergency Drugs available, Suction available and Patient being monitored Patient Re-evaluated:Patient Re-evaluated prior to induction Oxygen Delivery Method: Circle system utilized Preoxygenation: Pre-oxygenation with 100% oxygen Induction Type: IV induction Ventilation: Mask ventilation without difficulty Laryngoscope Size: Mac and 4 Grade View: Grade I Tube type: Oral Tube size: 7.5 mm Number of attempts: 1 Airway Equipment and Method: Stylet Placement Confirmation: ETT inserted through vocal cords under direct vision,  positive ETCO2 and breath sounds checked- equal and bilateral Secured at: 21 cm Tube secured with: Tape Dental Injury: Teeth and Oropharynx as per pre-operative assessment

## 2020-03-08 NOTE — Anesthesia Postprocedure Evaluation (Signed)
Anesthesia Post Note  Patient: Kathryn Harvey  Procedure(s) Performed: LAPAROSCOPIC GASTRIC SLEEVE RESECTION, Upper Endo, ERAS Pathway (N/A Abdomen)     Patient location during evaluation: PACU Anesthesia Type: General Level of consciousness: awake and alert Pain management: pain level controlled Vital Signs Assessment: post-procedure vital signs reviewed and stable Respiratory status: spontaneous breathing, nonlabored ventilation, respiratory function stable and patient connected to nasal cannula oxygen Cardiovascular status: blood pressure returned to baseline and stable Postop Assessment: no apparent nausea or vomiting Anesthetic complications: no    Last Vitals:  Vitals:   03/08/20 1215 03/08/20 1320  BP: (!) 107/54 120/72  Pulse: 66 64  Resp: 17 17  Temp: (!) 35.9 C (!) 35.9 C  SpO2: 100% 100%    Last Pain:  Vitals:   03/08/20 1215  TempSrc: Axillary  PainSc:                  Kijuan Gallicchio S

## 2020-03-08 NOTE — Progress Notes (Signed)
Discussed post op day goals with patient including ambulation, IS, diet progression, pain, and nausea control.  BSTOP education provided including BSTOP information guide, "Guide for Pain Management after your Bariatric Procedure".  Questions answered. 

## 2020-03-08 NOTE — Op Note (Signed)
Kathryn Harvey [id number] Apr 08, 1986 03/08/2020  Preoperative diagnosis: sleeve gastrectomy in progress  Postoperative diagnosis: Same   Procedure: Upper endoscopy   Surgeon: Susy Frizzle B. Daphine Deutscher  M.D., FACS   Anesthesia: Gen.   Indications for procedure: This patient was undergoing a sleeve gastrectomy by Dr. Sheliah Hatch.    Description of procedure: The endoscopy was placed in the mouth and into the oropharynx and under endoscopic vision it was advanced to the esophagogastric junction.  The pouch was insufflated and and a nice cylindrical sleeve was noted.  Easy endoscopy through the sleeve to the antrum.  Pics taken of posterior pylorus swelling that wasn't clear.      It is depicted above in the endoscopic photo. .   No bleeding or leaks were detected.  The scope was withdrawn without difficulty.     Matt B. Daphine Deutscher, MD, FACS General, Bariatric, & Minimally Invasive Surgery [facility], [place]

## 2020-03-08 NOTE — Progress Notes (Signed)
Pt started shakes at 2100.

## 2020-03-08 NOTE — Transfer of Care (Signed)
Immediate Anesthesia Transfer of Care Note  Patient: Kathryn Harvey  Procedure(s) Performed: LAPAROSCOPIC GASTRIC SLEEVE RESECTION, Upper Endo, ERAS Pathway (N/A Abdomen)  Patient Location: PACU  Anesthesia Type:General  Level of Consciousness: awake, alert  and oriented  Airway & Oxygen Therapy: Patient Spontanous Breathing and Patient connected to face mask oxygen  Post-op Assessment: Report given to RN and Post -op Vital signs reviewed and stable  Post vital signs: Reviewed and stable  Last Vitals:  Vitals Value Taken Time  BP    Temp    Pulse 81 03/08/20 0904  Resp 16 03/08/20 0904  SpO2 100 % 03/08/20 0904  Vitals shown include unvalidated device data.  Last Pain:  Vitals:   03/08/20 0613  TempSrc: Oral  PainSc: 0-No pain         Complications: No apparent anesthesia complications

## 2020-03-09 LAB — COMPREHENSIVE METABOLIC PANEL
ALT: 26 U/L (ref 0–44)
AST: 19 U/L (ref 15–41)
Albumin: 3.8 g/dL (ref 3.5–5.0)
Alkaline Phosphatase: 42 U/L (ref 38–126)
Anion gap: 6 (ref 5–15)
BUN: 10 mg/dL (ref 6–20)
CO2: 26 mmol/L (ref 22–32)
Calcium: 8.9 mg/dL (ref 8.9–10.3)
Chloride: 108 mmol/L (ref 98–111)
Creatinine, Ser: 0.63 mg/dL (ref 0.44–1.00)
GFR calc Af Amer: >60 mL/min (ref >60)
GFR calc non Af Amer: >60 mL/min (ref >60)
Glucose, Bld: 118 mg/dL — ABNORMAL HIGH (ref 70–99)
Potassium: 4 mmol/L (ref 3.5–5.1)
Sodium: 140 mmol/L (ref 135–145)
Total Bilirubin: 0.9 mg/dL (ref 0.3–1.2)
Total Protein: 6.7 g/dL (ref 6.5–8.1)

## 2020-03-09 LAB — CBC WITH DIFFERENTIAL/PLATELET
Abs Immature Granulocytes: 0.07 10*3/uL (ref 0.00–0.07)
Basophils Absolute: 0 10*3/uL (ref 0.0–0.1)
Basophils Relative: 0 %
Eosinophils Absolute: 0 10*3/uL (ref 0.0–0.5)
Eosinophils Relative: 0 %
HCT: 34.7 % — ABNORMAL LOW (ref 36.0–46.0)
Hemoglobin: 11.3 g/dL — ABNORMAL LOW (ref 12.0–15.0)
Immature Granulocytes: 0 %
Lymphocytes Relative: 17 %
Lymphs Abs: 2.8 10*3/uL (ref 0.7–4.0)
MCH: 29.4 pg (ref 26.0–34.0)
MCHC: 32.6 g/dL (ref 30.0–36.0)
MCV: 90.1 fL (ref 80.0–100.0)
Monocytes Absolute: 1.3 10*3/uL — ABNORMAL HIGH (ref 0.1–1.0)
Monocytes Relative: 8 %
Neutro Abs: 12.5 10*3/uL — ABNORMAL HIGH (ref 1.7–7.7)
Neutrophils Relative %: 75 %
Platelets: 283 10*3/uL (ref 150–400)
RBC: 3.85 MIL/uL — ABNORMAL LOW (ref 3.87–5.11)
RDW: 13.2 % (ref 11.5–15.5)
WBC: 16.7 10*3/uL — ABNORMAL HIGH (ref 4.0–10.5)
nRBC: 0 % (ref 0.0–0.2)

## 2020-03-09 MED ORDER — ONDANSETRON 4 MG PO TBDP: 4.0000 mg | ORAL_TABLET | Freq: Four times a day (QID) | ORAL | 0 refills | Status: DC | PRN

## 2020-03-09 MED ORDER — GABAPENTIN 100 MG PO CAPS: 200.0000 mg | ORAL_CAPSULE | Freq: Two times a day (BID) | ORAL | 0 refills | Status: DC

## 2020-03-09 MED ORDER — PANTOPRAZOLE SODIUM 40 MG PO TBEC: 40.0000 mg | DELAYED_RELEASE_TABLET | Freq: Every day | ORAL | 0 refills | Status: DC

## 2020-03-09 MED ORDER — ACETAMINOPHEN 500 MG PO TABS: 1000.0000 mg | ORAL_TABLET | Freq: Three times a day (TID) | ORAL | 0 refills | Status: AC

## 2020-03-09 NOTE — Progress Notes (Signed)
Instructions were reviewed with patient. All questions were answered. Patient was transported to main entrance by wheelchair. ° °

## 2020-03-09 NOTE — Progress Notes (Signed)

## 2020-03-09 NOTE — Discharge Summary (Signed)
**Note Kathryn-Identified via Obfuscation** Physician Discharge Summary  Kathryn Harvey WGY:[id number] DOB: 1986-11-01 DOA: 03/08/2020  PCP: Kathryn Cha, MD  Admit date: 03/08/2020 Discharge date: 03/09/2020  Recommendations for Outpatient Follow-up:  1.  (include homehealth, outpatient follow-up instructions, specific recommendations for PCP to follow-up on, etc.)  Follow-up Information    Kathryn Harvey, Kathryn Bruce, MD. Go on 04/02/2020.   Specialty: General Surgery Why: at 1110.  Please arrive 15 minutes prior to appointment time.  Thank you Contact information: [place] [address] [phone]        Surgery, [place]. Go on 05/05/2020.   Specialty: General Surgery Why: Your appointment is with Dr Kathryn Harvey at 910 am,  Please arrive 15 minutes prior to your appointment time.  Thank you Contact information: [place]  [address] [phone]          Discharge Diagnoses:  Active Problems:   Morbid obesity ([place])   Surgical Procedure: laparoscopic sleeve gastrectomy, upper endoscopy  Discharge Condition: Good Disposition: Home  Diet recommendation: Postoperative sleeve gastrectomy diet (liquids only)  Filed Weights   03/08/20 0613  Weight: 127.2 kg     Hospital Course:  The patient was admitted after undergoing laparoscopic sleeve gastrectomy. POD 0 she ambulated well. POD 1 she was started on the water diet protocol and tolerated 500 ml in the first shift. Once meeting the water amount she was advanced to bariatric protein shakes which they tolerated and were discharged home POD 1.  Treatments: surgery: laparoscopic sleeve gastrectomy  Discharge Instructions  Discharge Instructions    Ambulate hourly while awake   Complete by: As directed    Call MD for:  difficulty breathing, headache or visual disturbances   Complete by: As directed    Call MD for:  persistant dizziness or light-headedness   Complete by: As directed    Call MD  for:  persistant nausea and vomiting   Complete by: As directed    Call MD for:  redness, tenderness, or signs of infection (pain, swelling, redness, odor or green/yellow discharge around incision site)   Complete by: As directed    Call MD for:  severe uncontrolled pain   Complete by: As directed    Call MD for:  temperature >101 F   Complete by: As directed    Diet bariatric full liquid   Complete by: As directed    Discharge wound care:   Complete by: As directed    Remove Bandaids tomorrow, ok to shower tomorrow. Steristrips may fall off in 1-3 weeks.   Incentive spirometry   Complete by: As directed    Perform hourly while awake     Allergies as of 03/09/2020      Reactions   Sulfa Antibiotics Swelling      Medication List    STOP taking these medications   amoxicillin 500 MG capsule Commonly known as: AMOXIL   fluconazole 150 MG tablet Commonly known as: DIFLUCAN   nystatin-triamcinolone ointment Commonly known as: MYCOLOG   omeprazole 20 MG capsule Commonly known as: PRILOSEC     TAKE these medications   acetaminophen 500 MG tablet Commonly known as: TYLENOL Take 2 tablets (1,000 mg total) by mouth every 8 (eight) hours for 5 days.   BARIATRIC MULTIVITAMINS/IRON PO Take 1 tablet by mouth in the morning and at bedtime.   CALCIUM CITRATE PO Take 1 tablet by mouth in the morning, at noon, and at bedtime.   ferrous sulfate 325 (65  FE) MG tablet Take 325 mg by mouth daily with breakfast.   gabapentin 100 MG capsule Commonly known as: NEURONTIN Take 2 capsules (200 mg total) by mouth every 12 (twelve) hours.   losartan 50 MG tablet Commonly known as: COZAAR Take 50 mg by mouth daily. Notes to patient: Monitor Blood Pressure Daily and keep a log for primary care physician.  You may need to make changes to your medications with rapid weight loss.     ondansetron 4 MG disintegrating tablet Commonly known as: ZOFRAN-ODT Take 1 tablet (4 mg total) by mouth  every 6 (six) hours as needed for nausea or vomiting.   pantoprazole 40 MG tablet Commonly known as: PROTONIX Take 1 tablet (40 mg total) by mouth daily.   Skyla 13.5 MG Iud Generic drug: Levonorgestrel 13.5 mg by Intrauterine route daily.            Discharge Care Instructions  (From admission, onward)         Start     Ordered   03/09/20 0000  Discharge wound care:    Comments: Remove Bandaids tomorrow, ok to shower tomorrow. Steristrips may fall off in 1-3 weeks.   03/09/20 0745         Follow-up Information    Kathryn Harvey, Kathryn Blanch, MD. Go on 04/02/2020.   Specialty: General Surgery Why: at 1110.  Please arrive 15 minutes prior to appointment time.  Thank you Contact information: [address] [place] [address] [phone]        Surgery, [place]. Go on 05/05/2020.   Specialty: General Surgery Why: Your appointment is with Dr Kathryn Harvey at 910 am,  Please arrive 15 minutes prior to your appointment time.  Thank you Contact information: [address] [place] [address] [phone]            The results of significant diagnostics from this hospitalization (including imaging, microbiology, ancillary and laboratory) are listed below for reference.    Significant Diagnostic Studies: No results found.  Labs: Basic Metabolic Panel: Recent Labs  Lab 03/03/20 0830 03/09/20 0423  NA 136 140  K 4.2 4.0  CL 105 108  CO2 23 26  GLUCOSE 93 118*  BUN 20 10  CREATININE 0.57 0.63  CALCIUM 9.3 8.9   Liver Function Tests: Recent Labs  Lab 03/03/20 0830 03/09/20 0423  AST 16 19  ALT 20 26  ALKPHOS 40 42  BILITOT 0.5 0.9  PROT 6.5 6.7  ALBUMIN 3.8 3.8    CBC: Recent Labs  Lab 03/03/20 0830 03/08/20 1033 03/09/20 0423  WBC 13.7*  --  16.7*  NEUTROABS 7.8*  --  12.5*  HGB 11.6* 11.7* 11.3*  HCT 36.5 36.1 34.7*  MCV 90.6  --  90.1  PLT 256  --  283    CBG: No results for input(s): GLUCAP in the  last 168 hours.  Active Problems:   Morbid obesity (HCC)   VTE plan: no chemical prophylaxis recommended ([email or web address])  Time coordinating discharge: 15 min

## 2020-03-11 ENCOUNTER — Encounter: Payer: Self-pay | Admitting: *Deleted

## 2020-03-11 ENCOUNTER — Other Ambulatory Visit: Payer: Self-pay | Admitting: *Deleted

## 2020-03-11 DIAGNOSIS — I10 Essential (primary) hypertension: Secondary | ICD-10-CM | POA: Insufficient documentation

## 2020-03-11 NOTE — Patient Outreach (Signed)
[facility]) Care Management  03/11/2020  JEROLINE WOLBERT 19-Apr-1986 [id number]   Transition of care call/case closure   Referral received: 03/10/20 Initial outreach:03/11/20 Insurance: [place] [employer]   Subjective: Initial successful telephone call to patient's preferred number in order to complete transition of care assessment; 2 HIPAA identifiers verified. Explained purpose of call and completed transition of care assessment.  Darnice states that she is doing better. She reports having a little pain at incision areas but it is manageable with prescribed medications. She reports incision sites without redness , drainage, steristrips in place.She report tolerating taking a shower, increasing walking . She continues to use her incentive spirometry . She report  tolerating liquid diet as recommended, denies nausea, she has been able to gradually increase intake amounts . She reports not having a bowel movement yet but she has taken Miralax on this morning. Patients' sister is available to assist in her recovery as needed visiting her daily   Discussed assessing [place] benefits: She discussed having history of hypertension and participates in the [facility] chronic disease management programs.  She does  have the hospital indemnity and has made contact to file claim.  She does not use a [facility] outpatient pharmacy.  She  denies educational needs related to staying safe during the COVID 19 pandemic.    Objective:  Tiffanie Blassingame  was hospitalized at [facility] from 4/5-03/09/20  For Laparoscopic gastric sleeve resection  Comorbidities include: Morbid Obesity, hypertension .  She was discharged to home on 03/09/20 without the need for home health services or DME.   Assessment:  Patient voices good understanding of all discharge instructions.  See transition of care flowsheet for assessment details.   Plan:  Reviewed hospital discharge diagnosis of Laparoscopic  gastric sleeve resection   and discharge treatment plan using hospital discharge instructions, assessing medication adherence, reviewing problems requiring provider notification, and discussing the importance of follow up with surgeon, primary care provider and/or specialists as directed. Reviewed [place] healthy lifestyle program information to receive discounted premium for  2022  Step 1: Get annual physical between December 04, 2018 and June 03, 2020; Step 2: Complete your health assessment between December 05, 2019 and August 04, 2020 at [email or web address] Step 3:Identify your current health status and complete the corresponding action step between January 1, and August 04, 2020.   Using Active Health Management ActiveAdvice View website, verified that patient is an active participate in [facility] Active Health Management chronic disease management program.    No ongoing care management needs identified so will close case to [facility] Care Management services and route successful outreach letter with [facility] Care Management pamphlet and 24 Hour Nurse Line Magnet to [employer] Care Management clinical pool to be mailed to patient's home address.  Thanked patient for their services to [facility].   Egbert Garibaldi, RN, BSN  [facility] Care Management,Care Management Coordinator  [phone]- Mobile [phone]- Toll Free Main Office

## 2020-03-15 ENCOUNTER — Telehealth (HOSPITAL_COMMUNITY): Payer: Self-pay

## 2020-03-15 NOTE — Telephone Encounter (Signed)
Patient called to discuss post bariatric surgery follow up questions.  See below:   1.  Tell me about your pain and pain management?only at largest incision  2.  Let's talk about fluid intake.  How much total fluid are you taking in?72 ounces  3.  How much protein have you taken in the last 2 days?60 grams protein  4.  Have you had nausea?  Tell me about when have experienced nausea and what you did to help?denies  5.  Has the frequency or color changed with your urine?no problem took   6.  Tell me what your incisions look like?look ok  7.  Have you been passing gas? BM?had bm Thursday after surgery with miralax  8.  If a problem or question were to arise who would you call?  Do you know contact numbers for [facility], [facility], and [facility]?aware of how to contact all services  9.  How has the walking going?walking daily for 30 minutes, moving around in the house  10.  How are your vitamins and calcium going?  How are you taking them?mvi and calcium without difficulty

## 2020-03-23 ENCOUNTER — Encounter: Payer: No Typology Code available for payment source | Attending: General Surgery | Admitting: Skilled Nursing Facility1

## 2020-03-23 ENCOUNTER — Other Ambulatory Visit: Payer: Self-pay

## 2020-03-23 DIAGNOSIS — E669 Obesity, unspecified: Secondary | ICD-10-CM | POA: Diagnosis not present

## 2020-03-25 NOTE — Progress Notes (Signed)
2 Week Post-Operative Nutrition Class   Patient was seen on 01/28/19 for Post-Operative Nutrition education at the [facility].    Surgery date: 03/08/2020 Surgery type: sleeve Start weight at [facility]: 285.7 Weight today: declined   Body Composition Scale Declined  Total Body Fat %   Visceral Fat   Fat-Free Mass %    Total Body Water %    Muscle-Mass lbs   Body Fat Displacement          Torso  lbs          Left Leg  lbs          Right Leg  lbs          Left Arm  lbs          Right Arm   lbs      The following the learning objectives were met by the patient during this course:  Identifies Phase 3 (Soft, High Proteins) Dietary Goals and will begin from 2 weeks post-operatively to 2 months post-operatively  Identifies appropriate sources of fluids and proteins   States protein recommendations and appropriate sources post-operatively  Identifies the need for appropriate texture modifications, mastication, and bite sizes when consuming solids  Identifies appropriate multivitamin and calcium sources post-operatively  Describes the need for physical activity post-operatively and will follow MD recommendations  States when to call healthcare provider regarding medication questions or post-operative complications   Handouts given during class include:  Phase 3A: Soft, High Protein Diet Handout   Follow-Up Plan: Patient will follow-up at [facility] in 6 weeks for 2 month post-op nutrition visit for diet advancement per MD.

## 2020-03-29 ENCOUNTER — Telehealth: Payer: Self-pay | Admitting: Skilled Nursing Facility1

## 2020-03-29 NOTE — Telephone Encounter (Signed)
RD called pt to verify fluid intake once starting soft, solid proteins 2 week post-bariatric surgery.  ° °Daily Fluid intake: 64+ °Daily Protein intake: 60+ ° °Concerns/issues:  ° °None stated °

## 2020-05-04 ENCOUNTER — Encounter: Payer: Self-pay | Admitting: Dietician

## 2020-05-04 ENCOUNTER — Other Ambulatory Visit: Payer: Self-pay

## 2020-05-04 ENCOUNTER — Encounter: Payer: No Typology Code available for payment source | Attending: General Surgery | Admitting: Dietician

## 2020-05-04 DIAGNOSIS — E669 Obesity, unspecified: Secondary | ICD-10-CM | POA: Insufficient documentation

## 2020-05-04 NOTE — Progress Notes (Signed)
Bariatric Nutrition Follow-Up Visit Medical Nutrition Therapy  Appt Start Time: 8:50am    End Time: 9:20am  2 Months Post-Operative Sleeve Surgery Surgery Date: 03/08/2020  Pt's Expectations of Surgery/ Goals: to maintain weight loss, come off of high blood pressure medication, be more active  Pt Reported Successes: more energy    NUTRITION ASSESSMENT  Anthropometrics  Start weight at [facility]: 285.7 lbs (date: 12/24/2019) Today's weight: 256.5 lbs  Body Composition Scale 05/04/2020  Weight  lbs 256.5  BMI 42.3  Total Body Fat  % 44.4     Visceral Fat 13  Fat-Free Mass  % 55.5     Total Body Water  % 42.2     Muscle-Mass  lbs 33.8  Body Fat Displacement ---         Torso  lbs 70.6         Left Leg  lbs 14.1         Right Leg  lbs 14.1         Left Arm  lbs 7         Right Arm  lbs 7    Lifestyle & Dietary Hx Foods eaten include [place] burger, tuna packets, soy products, shrimp, seafood, eggs, yogurt, chicken, kidney beans, black beans. States she tolerates foods well and drinks only water and coffee. States she is often hungry in the mornings and we discussed getting in protein shake sooner than 11:30am. States she has had some mild hair loss, takes biotin, iron, and her bariatric supplements. No issues reported other than some constipation.   24-Hr Dietary Recall First Meal: - Snack: protein shake  Second Meal: chicken + kidney beans   Snack:  Third Meal: yogurt (or seafood)  Snack: - Beverages: coffee, water   Estimated daily fluid intake: 60 oz Estimated daily protein intake: 60+ g Supplements: biotin, iron, bariatric MVI, calcium  Current average weekly physical activity: trainer 3 days/week    Post-Op Goals/ Signs/ Symptoms Using straws: no Drinking while eating: no Chewing/swallowing difficulties: no Changes in vision: no Changes to mood/headaches: no Hair loss/changes to skin/nails: hair loss  Difficulty focusing/concentrating: no Sweating:  no Dizziness/lightheadedness: no Palpitations: no  Carbonated/caffeinated beverages: coffee  N/V/D/C/Gas: constipation  Abdominal pain: no Dumping syndrome: no   NUTRITION DIAGNOSIS  Overweight/obesity ([place]-3.3) related to past poor dietary habits and physical inactivity as evidenced by completed bariatric surgery and following dietary guidelines for continued weight loss and healthy nutrition status.   NUTRITION INTERVENTION Nutrition counseling (C-1) and education (E-2) to facilitate bariatric surgery goals, including: . Diet advancement to the next phase (phase 4) now including non-starchy vegetables . The importance of consuming adequate calories as well as certain nutrients daily due to the body's need for essential vitamins, minerals, and fats . The importance of daily physical activity and to reach a goal of at least 150 minutes of moderate to vigorous physical activity weekly (or as directed by their physician) due to benefits such as increased musculature and improved lab values  Handouts Provided Include   Phase 4: Protein + Non-Starchy Vegetables   Learning Style & Readiness for Change Teaching method utilized: Visual & Auditory  Demonstrated degree of understanding via: Teach Back  Barriers to learning/adherence to lifestyle change: None Identified    MONITORING & EVALUATION Dietary intake, weekly physical activity, body weight, and goals in 3-4 months.  Next Steps Patient is to follow-up in 3-4 months for 6 month post-op follow-up.

## 2020-05-04 NOTE — Patient Instructions (Signed)

## 2020-09-14 ENCOUNTER — Ambulatory Visit: Payer: No Typology Code available for payment source

## 2020-10-03 ENCOUNTER — Other Ambulatory Visit: Payer: Self-pay

## 2020-10-03 ENCOUNTER — Ambulatory Visit
Admission: EM | Admit: 2020-10-03 | Discharge: 2020-10-03 | Disposition: A | Discharge status: Home / Self Care | Payer: No Typology Code available for payment source | Attending: Physician Assistant | Admitting: Physician Assistant

## 2020-10-03 DIAGNOSIS — L509 Urticaria, unspecified: Secondary | ICD-10-CM | POA: Diagnosis not present

## 2020-10-03 MED ORDER — PREDNISONE 50 MG PO TABS: 50.0000 mg | ORAL_TABLET | Freq: Every day | ORAL | 0 refills | Status: DC

## 2020-10-03 NOTE — ED Triage Notes (Signed)
Pt states she has developed a rash x 2 weeks ago and has been using otc meds to try to treat it with no relief. Pt is aox4 and ambulatory.

## 2020-10-03 NOTE — ED Provider Notes (Signed)
[facility]    CSN: [id number] Arrival date & time: 10/03/20  5885      History   Chief Complaint Chief Complaint  Patient presents with  . Rash    x 2 weeks    HPI Kathryn Harvey is a 34 y.o. female.   34 year old female comes in for 2 week history of rash. Denies itching, pain, irritation. Notices the rash due to appearance. Denies fever, URI symptoms. Does put toning cream on abdomen, but this is not new. No obvious changes in hygiene product. Antihistamine without relief. Rash had been to the abdomen, back. However, has now spread to BUE and came in for evaluation.       Past Medical History:  Diagnosis Date  . GERD (gastroesophageal reflux disease)   . Hypertension     Patient Active Problem List   Diagnosis Date Noted  . Essential hypertension 03/11/2020  . Obesity 03/08/2020    Past Surgical History:  Procedure Laterality Date  . LAPAROSCOPIC GASTRIC SLEEVE RESECTION N/A 03/08/2020   Procedure: LAPAROSCOPIC GASTRIC SLEEVE RESECTION, Upper Endo, ERAS Pathway;  Surgeon: Kinsinger, De Blanch, MD;  Location: [facility];  Service: General;  Laterality: N/A;  . NO PAST SURGERIES      OB History   No obstetric history on file.      Home Medications    Prior to Admission medications   Medication Sig Start Date End Date Taking? Authorizing Provider  CALCIUM CITRATE PO Take 1 tablet by mouth in the morning, at noon, and at bedtime.   Yes [provider]  ferrous sulfate 325 (65 FE) MG tablet Take 325 mg by mouth daily with breakfast.   Yes [provider]  losartan (COZAAR) 50 MG tablet Take 50 mg by mouth daily. 10/02/19  Yes [provider]  Multiple Vitamins-Minerals (BARIATRIC MULTIVITAMINS/IRON PO) Take 1 tablet by mouth in the morning and at bedtime.   Yes [provider]  gabapentin (NEURONTIN) 100 MG capsule Take 2 capsules (200 mg total) by mouth every 12 (twelve) hours. 03/09/20   Kinsinger, De Blanch, MD    predniSONE (DELTASONE) 50 MG tablet Take 1 tablet (50 mg total) by mouth daily with breakfast. 10/03/20   Belinda Fisher, PA-C  Levonorgestrel (SKYLA) 13.5 MG IUD 13.5 mg by Intrauterine route daily.  10/03/20  [provider]  pantoprazole (PROTONIX) 40 MG tablet Take 1 tablet (40 mg total) by mouth daily. 03/09/20 10/03/20  Kinsinger, De Blanch, MD    Family History Family History  Problem Relation Age of Onset  . Arthritis Mother   . Cancer Mother   . Hypertension Mother   . Diabetes Sister   . Thyroid disease Sister   . Hypertension Sister     Social History Social History   Tobacco Use  . Smoking status: Former Smoker    Years: 2.00    Types: Cigars    Quit date: 06/23/2019    Years since quitting: 1.2  . Smokeless tobacco: Never Used  Vaping Use  . Vaping Use: Never used  Substance Use Topics  . Alcohol use: No    Alcohol/week: 0.0 standard drinks  . Drug use: No     Allergies   Sulfa antibiotics   Review of Systems Review of Systems  Reason unable to perform ROS: See HPI as above.     Physical Exam Triage Vital Signs ED Triage Vitals  Enc Vitals Group     BP 10/03/20 1016 115/77  Pulse Rate 10/03/20 1016 92     Resp 10/03/20 1016 16     Temp 10/03/20 1016 99.2 F (37.3 C)     Temp Source 10/03/20 1016 Oral     SpO2 10/03/20 1016 99 %     Weight --      Height --      Head Circumference --      Peak Flow --      Pain Score 10/03/20 1027 0     Pain Loc --      Pain Edu? --      Excl. in GC? --    No data found.  Updated Vital Signs BP 115/77 (BP Location: Left Arm)   Pulse 92   Temp 99.2 F (37.3 C) (Oral)   Resp 16   LMP 09/12/2020 (Approximate)   SpO2 99%   Physical Exam Constitutional:      General: She is not in acute distress.    Appearance: Normal appearance. She is well-developed. She is not toxic-appearing or diaphoretic.  HENT:     Head: Normocephalic and atraumatic.  Eyes:     Conjunctiva/sclera: Conjunctivae  normal.     Pupils: Pupils are equal, round, and reactive to light.  Pulmonary:     Effort: Pulmonary effort is normal. No respiratory distress.  Musculoskeletal:     Cervical back: Normal range of motion and neck supple.  Skin:    General: Skin is warm and dry.     Comments: Hives to the trunk, BUE. No erythema, warmth  Neurological:     Mental Status: She is alert and oriented to person, place, and time.      UC Treatments / Results  Labs (all labs ordered are listed, but only abnormal results are displayed) Labs Reviewed - No data to display  EKG   Radiology No results found.  Procedures Procedures (including critical care time)  Medications Ordered in UC Medications - No data to display  Initial Impression / Assessment and Plan / UC Course  I have reviewed the triage vital signs and the nursing notes.  Pertinent labs & imaging results that were available during my care of the patient were reviewed by me and considered in my medical decision making (see chart for details).    Hives as above. Unsure what is causing symptoms. For now, can trial prednisone for possible allergic/inflammatory reaction. Return precautions given.  Final Clinical Impressions(s) / UC Diagnoses   Final diagnoses:  Hives    ED Prescriptions    Medication Sig Dispense Auth. Provider   predniSONE (DELTASONE) 50 MG tablet Take 1 tablet (50 mg total) by mouth daily with breakfast. 5 tablet Belinda Fisher, PA-C     PDMP not reviewed this encounter.   Belinda Fisher, PA-C 10/03/20 1327

## 2020-10-03 NOTE — Discharge Instructions (Signed)
As discussed, unsure what is causing symptoms, though no alarming signs on exam. Trial prednisone, this can calm down inflammation/allergic causes of symptoms. Monitor for any new product changes. Follow up with PCP if symptoms not improving. If having fever, spreading redness, follow up for reevaluation.

## 2020-11-16 ENCOUNTER — Encounter: Payer: No Typology Code available for payment source | Attending: General Surgery | Admitting: Skilled Nursing Facility1

## 2020-11-16 ENCOUNTER — Other Ambulatory Visit: Payer: Self-pay

## 2020-11-16 DIAGNOSIS — E669 Obesity, unspecified: Secondary | ICD-10-CM | POA: Insufficient documentation

## 2020-11-17 NOTE — Progress Notes (Signed)
Follow-up visit:  Post-Operative sleeve Surgery  Medical Nutrition Therapy:  Appt start time: 6:00pm end time:  7:00pm  Primary concerns today: Post-operative Bariatric Surgery Nutrition Management 6 Month Post-Op Class  Anthropometrics  Start weight at [facility]: 285.7 lbs (date: 12/24/2019) Today's weight: 236.6 lbs  Body Composition Scale 05/04/2020 11/17/2020  Weight  lbs 256.5 236.6  BMI 42.3 39  Total Body Fat  % 44.4 42.3     Visceral Fat 13 12  Fat-Free Mass  % 55.5 57.6     Total Body Water  % 42.2 43.3     Muscle-Mass  lbs 33.8 33.6  Body Fat Displacement ---          Torso  lbs 70.6 62         Left Leg  lbs 14.1 12.4         Right Leg  lbs 14.1 12.4         Left Arm  lbs 7 6.2         Right Arm  lbs 7 6.2      Information Reviewed/ Discussed During Appointment: -Review of composition scale numbers -Fluid requirements (64-100 ounces) -Protein requirements (60-80g) -Strategies for tolerating diet -Advancement of diet to include Starchy vegetables -Barriers to inclusion of new foods -Inclusion of appropriate multivitamin and calcium supplements  -Exercise recommendations   Fluid intake: adequate   Medications: See List Supplementation: appropriate   Using straws: no Drinking while eating: no Having you been chewing well: yes Chewing/swallowing difficulties: no Changes in vision: no Changes to mood/headaches: no Hair loss/Cahnges to skin/Changes to nails: no Any difficulty focusing or concentrating: no Sweating: no Dizziness/Lightheaded: no Palpitations: no  Carbonated beverages: no N/V/D/C/GAS: no Abdominal Pain: no Dumping syndrome: no  Recent physical activity:  ADL's  Progress Towards Goal(s):  In Progress Teaching method utilized: Visual & Auditory  Demonstrated degree of understanding via: Teach Back  Readiness Level: Action Barriers to learning/adherence to lifestyle change: none identified  Handouts given during visit include:  Phase V diet  Progression   Goals Sheet  The Benefits of Exercise are endless.....  Support Group Topics  Pt Chosen Goals:  I will eat in the/at the ___table___________ every time I eat a meal by (specific date) _____1/1/2022__________ I will say 2 nice things to and about myself 7 days a week by (specific date) ____1/1/22__________  Teaching Method Utilized:  Visual Auditory Hands on  Demonstrated degree of understanding via:  Teach Back   Monitoring/Evaluation:  Dietary intake, exercise, and body weight. Follow up in 3 months for 9 month post-op visit.

## 2020-12-01 ENCOUNTER — Ambulatory Visit
Admission: EM | Admit: 2020-12-01 | Discharge: 2020-12-01 | Disposition: A | Discharge status: Home / Self Care | Payer: No Typology Code available for payment source

## 2020-12-01 ENCOUNTER — Other Ambulatory Visit: Payer: Self-pay

## 2020-12-01 DIAGNOSIS — Z20822 Contact with and (suspected) exposure to covid-19: Secondary | ICD-10-CM | POA: Diagnosis not present

## 2020-12-01 MED ORDER — BENZONATATE 200 MG PO CAPS: 200.0000 mg | ORAL_CAPSULE | Freq: Three times a day (TID) | ORAL | 0 refills | Status: AC | PRN

## 2020-12-01 MED ORDER — IBUPROFEN 800 MG PO TABS: 800.0000 mg | ORAL_TABLET | Freq: Three times a day (TID) | ORAL | 0 refills | Status: DC

## 2020-12-01 MED ORDER — CETIRIZINE HCL 10 MG PO CAPS: 10.0000 mg | ORAL_CAPSULE | Freq: Every day | ORAL | 0 refills | Status: AC

## 2020-12-01 NOTE — ED Provider Notes (Signed)
[facility]    CSN: [id number] Arrival date & time: 12/01/20  1104      History   Chief Complaint Chief Complaint  Patient presents with  . Fever  . Sore Throat    HPI Kathryn Harvey is a 34 y.o. female history of hypertension presenting today for evaluation of fevers chills headaches and sore throat.  Reports daughter positive for Covid.  Symptoms began 2 to 3 days ago.  HPI  Past Medical History:  Diagnosis Date  . GERD (gastroesophageal reflux disease)   . Hypertension     Patient Active Problem List   Diagnosis Date Noted  . Essential hypertension 03/11/2020  . Obesity 03/08/2020    Past Surgical History:  Procedure Laterality Date  . LAPAROSCOPIC GASTRIC SLEEVE RESECTION N/A 03/08/2020   Procedure: LAPAROSCOPIC GASTRIC SLEEVE RESECTION, Upper Endo, ERAS Pathway;  Surgeon: Kinsinger, De Blanch, MD;  Location: [facility];  Service: General;  Laterality: N/A;  . NO PAST SURGERIES      OB History   No obstetric history on file.      Home Medications    Prior to Admission medications   Medication Sig Start Date End Date Taking? Authorizing Provider  benzonatate (TESSALON) 200 MG capsule Take 1 capsule (200 mg total) by mouth 3 (three) times daily as needed for up to 7 days for cough. 12/01/20 12/08/20 Yes Kindrick Lankford C, PA-C  Cetirizine HCl 10 MG CAPS Take 1 capsule (10 mg total) by mouth daily for 10 days. 12/01/20 12/11/20 Yes Terrian Ridlon C, PA-C  ibuprofen (ADVIL) 800 MG tablet Take 1 tablet (800 mg total) by mouth 3 (three) times daily. 12/01/20  Yes Coleby Yett, Junius Creamer, PA-C  PRESCRIPTION MEDICATION "Birth control"--unknown name   Yes [provider]  CALCIUM CITRATE PO Take 1 tablet by mouth in the morning, at noon, and at bedtime.    [provider]  ferrous sulfate 325 (65 FE) MG tablet Take 325 mg by mouth daily with breakfast.    [provider]  gabapentin (NEURONTIN) 100 MG capsule Take 2 capsules (200 mg  total) by mouth every 12 (twelve) hours. 03/09/20   Kinsinger, De Blanch, MD  losartan (COZAAR) 50 MG tablet Take 50 mg by mouth daily. 10/02/19   [provider]  Multiple Vitamins-Minerals (BARIATRIC MULTIVITAMINS/IRON PO) Take 1 tablet by mouth in the morning and at bedtime.    [provider]  predniSONE (DELTASONE) 50 MG tablet Take 1 tablet (50 mg total) by mouth daily with breakfast. 10/03/20   Cathie Hoops, Amy V, PA-C  Levonorgestrel (SKYLA) 13.5 MG IUD 13.5 mg by Intrauterine route daily.  10/03/20  [provider]  pantoprazole (PROTONIX) 40 MG tablet Take 1 tablet (40 mg total) by mouth daily. 03/09/20 10/03/20  Kinsinger, De Blanch, MD    Family History Family History  Problem Relation Age of Onset  . Arthritis Mother   . Cancer Mother   . Hypertension Mother   . Diabetes Sister   . Thyroid disease Sister   . Hypertension Sister     Social History Social History   Tobacco Use  . Smoking status: Former Smoker    Years: 2.00    Types: Cigars    Quit date: 06/23/2019    Years since quitting: 1.4  . Smokeless tobacco: Never Used  Vaping Use  . Vaping Use: Never used  Substance Use Topics  . Alcohol use: No    Alcohol/week: 0.0 standard drinks  . Drug use: No  Allergies   Sulfa antibiotics   Review of Systems Review of Systems  Constitutional: Positive for fever. Negative for activity change, appetite change, chills and fatigue.  HENT: Positive for congestion, rhinorrhea and sore throat. Negative for ear pain, sinus pressure and trouble swallowing.   Eyes: Negative for discharge and redness.  Respiratory: Positive for cough. Negative for chest tightness and shortness of breath.   Cardiovascular: Negative for chest pain.  Gastrointestinal: Negative for abdominal pain, diarrhea, nausea and vomiting.  Musculoskeletal: Negative for myalgias.  Skin: Negative for rash.  Neurological: Negative for dizziness, light-headedness and headaches.      Physical Exam Triage Vital Signs ED Triage Vitals  Enc Vitals Group     BP 12/01/20 1435 114/70     Pulse Rate 12/01/20 1435 60     Resp 12/01/20 1435 18     Temp 12/01/20 1435 97.9 F (36.6 C)     Temp Source 12/01/20 1435 Oral     SpO2 12/01/20 1435 98 %     Weight 12/01/20 1431 232 lb (105.2 kg)     Height 12/01/20 1431 5\' 5"  (1.651 m)     Head Circumference --      Peak Flow --      Pain Score 12/01/20 1431 0     Pain Loc --      Pain Edu? --      Excl. in GC? --    No data found.  Updated Vital Signs BP 114/70 (BP Location: Left Arm)   Pulse 60   Temp 97.9 F (36.6 C) (Oral)   Resp 18   Ht 5\' 5"  (1.651 m)   Wt 232 lb (105.2 kg)   LMP 11/24/2020 (Approximate)   SpO2 98%   BMI 38.61 kg/m   Visual Acuity Right Eye Distance:   Left Eye Distance:   Bilateral Distance:    Right Eye Near:   Left Eye Near:    Bilateral Near:     Physical Exam Vitals and nursing note reviewed.  Constitutional:      Appearance: She is well-developed and well-nourished.     Comments: No acute distress  HENT:     Head: Normocephalic and atraumatic.     Ears:     Comments: Bilateral ears without tenderness to palpation of external auricle, tragus and mastoid, EAC's without erythema or swelling, TM's with good bony landmarks and cone of light. Non erythematous.     Nose: Nose normal.     Mouth/Throat:     Comments: Oral mucosa pink and moist, no tonsillar enlargement or exudate. Posterior pharynx patent and nonerythematous, no uvula deviation or swelling. Normal phonation. Eyes:     Conjunctiva/sclera: Conjunctivae normal.  Cardiovascular:     Rate and Rhythm: Normal rate.  Pulmonary:     Effort: Pulmonary effort is normal. No respiratory distress.     Comments: Breathing comfortably at rest, CTABL, no wheezing, rales or other adventitious sounds auscultated Abdominal:     General: There is no distension.  Musculoskeletal:        General: Normal range of motion.      Cervical back: Neck supple.  Skin:    General: Skin is warm and dry.  Neurological:     Mental Status: She is alert and oriented to person, place, and time.  Psychiatric:        Mood and Affect: Mood and affect normal.      UC Treatments / Results  Labs (all labs ordered are listed, but  only abnormal results are displayed) Labs Reviewed  NOVEL CORONAVIRUS, NAA    EKG   Radiology No results found.  Procedures Procedures (including critical care time)  Medications Ordered in UC Medications - No data to display  Initial Impression / Assessment and Plan / UC Course  I have reviewed the triage vital signs and the nursing notes.  Pertinent labs & imaging results that were available during my care of the patient were reviewed by me and considered in my medical decision making (see chart for details).     Covid exposure at home, high suspicion of Covid.  Test pending.  Recommending symptomatic and supportive care.  Exam reassuring today, vital signs stable.  Rest and fluids.  Discussed strict return precautions. Patient verbalized understanding and is agreeable with plan.  Final Clinical Impressions(s) / UC Diagnoses   Final diagnoses:  Suspected COVID-19 virus infection     Discharge Instructions     Covid test pending, monitor my chart for results Tylenol and ibuprofen for headaches body aches sore throat and any fevers Daily cetirizine to help with postnasal drainage and throat irritation May use Tessalon for cough Rest and fluids Follow-up if not improving or worsening    ED Prescriptions    Medication Sig Dispense Auth. Provider   ibuprofen (ADVIL) 800 MG tablet Take 1 tablet (800 mg total) by mouth 3 (three) times daily. 21 tablet Layani Foronda C, PA-C   benzonatate (TESSALON) 200 MG capsule Take 1 capsule (200 mg total) by mouth 3 (three) times daily as needed for up to 7 days for cough. 28 capsule Lexia Vandevender C, PA-C   Cetirizine HCl 10 MG CAPS  Take 1 capsule (10 mg total) by mouth daily for 10 days. 10 capsule Theador Jezewski, Dalzell C, PA-C     PDMP not reviewed this encounter.   Lew Dawes, PA-C 12/01/20 1528

## 2020-12-01 NOTE — ED Triage Notes (Signed)
Pt presents to Urgent Care with c/o fever, chills, sore throat, and headache since 11/29/20.  Pt's youngest dgt tested positive for COVID in ED today. Pt has been vaccinated against COVID and flu.

## 2020-12-01 NOTE — Discharge Instructions (Signed)
Covid test pending, monitor my chart for results Tylenol and ibuprofen for headaches body aches sore throat and any fevers Daily cetirizine to help with postnasal drainage and throat irritation May use Tessalon for cough Rest and fluids Follow-up if not improving or worsening

## 2020-12-02 LAB — SARS-COV-2, NAA 2 DAY TAT

## 2020-12-02 LAB — NOVEL CORONAVIRUS, NAA: SARS-CoV-2, NAA: DETECTED — AB

## 2020-12-13 ENCOUNTER — Ambulatory Visit (INDEPENDENT_AMBULATORY_CARE_PROVIDER_SITE_OTHER): Payer: No Typology Code available for payment source | Admitting: Allergy

## 2020-12-13 ENCOUNTER — Encounter: Payer: Self-pay | Admitting: Allergy

## 2020-12-13 ENCOUNTER — Other Ambulatory Visit: Payer: Self-pay

## 2020-12-13 VITALS — Temp 99.1°F | Ht 65.0 in | Wt 229.0 lb

## 2020-12-13 DIAGNOSIS — R21 Rash and other nonspecific skin eruption: Secondary | ICD-10-CM | POA: Diagnosis not present

## 2020-12-13 MED ORDER — TRIAMCINOLONE ACETONIDE 0.1 % EX CREA: 1.0000 "application " | TOPICAL_CREAM | Freq: Two times a day (BID) | CUTANEOUS | 2 refills | Status: DC | PRN

## 2020-12-13 NOTE — Patient Instructions (Addendum)
Rash:  Not sure what's triggering your rash.  No need for food/environmetla allergy testing today.   Definitely avoid that green tea detox drink - I don't have any testing for that.   If it flares, take pictures and let us know.  See below for proper skin care.  May use triamcinolone 0.1% ointment twice a day as needed for the rash. Do not use on the face, neck, armpits or groin area. Do not use more than 3 weeks in a row.   If rashes reoccur then recommend patch testing next.   Patches are best placed on Monday with return to office on Wednesday and Friday of same week for readings.  Patches once placed should not get wet.  You do not have to stop any medications for patch testing but should not be on oral prednisone. You can schedule a patch testing visit when convenient for your schedule.    Follow up as needed.  True Test looks for the following sensitivities:        Skin care recommendations  Bath time: . Always use lukewarm water. AVOID very hot or cold water. Marland Kitchen Keep bathing time to 5-10 minutes. . Do NOT use bubble bath. . Use a mild soap and use just enough to wash the dirty areas. . Do NOT scrub skin vigorously.  . After bathing, pat dry your skin with a towel. Do NOT rub or scrub the skin.  Moisturizers and prescriptions:  . ALWAYS apply moisturizers immediately after bathing (within 3 minutes). This helps to lock-in moisture. . Use the moisturizer several times a day over the whole body. Peri [place] summer moisturizers include: Aveeno, CeraVe, Cetaphil. Peri [place] winter moisturizers include: Aquaphor, Vaseline, Cerave, Cetaphil, Eucerin, Vanicream. . When using moisturizers along with medications, the moisturizer should be applied about one hour after applying the medication to prevent diluting effect of the medication or moisturize around where you applied the medications. When not using medications, the moisturizer can be continued twice daily as maintenance.  Laundry  and clothing: . Avoid laundry products with added color or perfumes. . Use unscented hypo-allergenic laundry products such as Tide free, Cheer free & gentle, and All free and clear.  . If the skin still seems dry or sensitive, you can try double-rinsing the clothes. . Avoid tight or scratchy clothing such as wool. . Do not use fabric softeners or dyer sheets.

## 2020-12-13 NOTE — Assessment & Plan Note (Signed)
Rash started a couple months ago after gastric sleeve surgery.  She initially thought it was due to drinking a green tea detox drink.  Tried triamcinolone and hydrocortisone cream with good benefit.  Tried some various body creams and some flared her symptoms. Usually flares once a month for 1 week at a time. Oral steroids helped. Denies any other changes in medications or recent infections.  Not sure what's triggering the rash.   No need for food/environmental allergy testing today based on above clinical history.    Definitely avoid that green tea detox drink - I don't have any testing for that.   If it flares, take pictures and let us know.  See below for proper skin care.  May use triamcinolone 0.1% ointment twice a day as needed for the rash. Do not use on the face, neck, armpits or groin area. Do not use more than 3 weeks in a row.   If rashes reoccur then recommend patch testing next.

## 2020-12-13 NOTE — Progress Notes (Addendum)
New Patient Note  RE: Kathryn Harvey MRN: [id number] DOB: 07-Jan-1986 Date of Office Visit: 12/13/2020  Referring provider: Lorenda Ishihara,* Primary care provider: Lorenda Ishihara, MD  Chief Complaint: Eczema (Uses hydrocortisone not daily on arms/inner thighs/stomach ) and Allergic Reaction (Coco butter and Aveeno with coco butter caused skin reaction- red bumps )  History of Present Illness: I had the pleasure of seeing Kathryn Harvey for initial evaluation at the [facility] on 12/13/2020. She is a 35 y.o. female, who is referred here by Lorenda Ishihara, MD for the evaluation of rash.  Rash started about 4-5 months ago. Mainly occurs on her arms, abdominal area and upper thighs. Describes them as erythematous rash, bumpy. Individual rashes lasts about 1 week at a time. No ecchymosis upon resolution. Associated symptoms include: none. Suspected triggers are unknown - possibly a detox tea which contained green tea. She drank this for 2 weeks and now stopped. Patient had gastric sleeve in April and noticed after the surgery she is having issues with her skin.  Denies any fevers, chills, changes in medications or recent infections. She has tried the following therapies: hydrocortisone cream, triamcinolone with good benefit. Systemic steroids yes - with some benefit. Currently on no medications.  Last flare was last week - usually has a flare once a month or so.   Previous work up includes: no. Previous history of rash/hives: no.  10/03/2020 UC visit: "35 year old female comes in for 2 week history of rash. Denies itching, pain, irritation. Notices the rash due to appearance. Denies fever, URI symptoms. Does put toning cream on abdomen, but this is not new. No obvious changes in hygiene product. Antihistamine without relief. Rash had been to the abdomen, back. However, has now spread to BUE and came in for evaluation."  Assessment and Plan: Kathryn Harvey is a  35 y.o. female with: Rash and other nonspecific skin eruption Rash started a couple months ago after gastric sleeve surgery.  She initially thought it was due to drinking a green tea detox drink.  Tried triamcinolone and hydrocortisone cream with good benefit.  Tried some various body creams and some flared her symptoms. Usually flares once a month for 1 week at a time. Oral steroids helped. Denies any other changes in medications or recent infections.  Not sure what's triggering the rash.   No need for food/environmental allergy testing today based on above clinical history.    Definitely avoid that green tea detox drink - I don't have any testing for that.   If it flares, take pictures and let us know.  See below for proper skin care.  May use triamcinolone 0.1% ointment twice a day as needed for the rash. Do not use on the face, neck, armpits or groin area. Do not use more than 3 weeks in a row.   If rashes reoccur then recommend patch testing next.   Return if symptoms worsen or fail to improve.  Meds ordered this encounter  Medications  . triamcinolone (KENALOG) 0.1 %    Sig: Apply 1 application topically 2 (two) times daily as needed. Rash. Do not use on the face, neck, armpits or groin area. Do not use more than 3 weeks in a row.    Dispense:  45 g    Refill:  2   Other allergy screening: Asthma: no Rhino conjunctivitis: yes  Mild rhinitis symptoms usually in the spring and takes Claritin prn with good benefit.  Food allergy: no  Medication allergy: yes Hymenoptera allergy: no History of recurrent infections suggestive of immunodeficency: no  Diagnostics: Skin Testing: None.  Past Medical History: Patient Active Problem List   Diagnosis Date Noted  . Rash and other nonspecific skin eruption 12/13/2020  . Essential hypertension 03/11/2020  . Obesity 03/08/2020   Past Medical History:  Diagnosis Date  . Eczema   . GERD (gastroesophageal reflux disease)   .  Hypertension    Past Surgical History: Past Surgical History:  Procedure Laterality Date  . LAPAROSCOPIC GASTRIC SLEEVE RESECTION N/A 03/08/2020   Procedure: LAPAROSCOPIC GASTRIC SLEEVE RESECTION, Upper Endo, ERAS Pathway;  Surgeon: Kinsinger, De Blanch, MD;  Location: [facility];  Service: General;  Laterality: N/A;  . NO PAST SURGERIES     Medication List:  Current Outpatient Medications  Medication Sig Dispense Refill  . CALCIUM CITRATE PO Take 500 mg by mouth in the morning, at noon, and at bedtime.    . ferrous sulfate 325 (65 FE) MG tablet Take 325 mg by mouth daily with breakfast.    . losartan (COZAAR) 50 MG tablet Take 50 mg by mouth daily.    . Multiple Vitamins-Minerals (BARIATRIC MULTIVITAMINS/IRON PO) Take 1 tablet by mouth in the morning and at bedtime.    Marland Kitchen PRESCRIPTION MEDICATION "Birth control"--unknown name    . triamcinolone (KENALOG) 0.1 % Apply 1 application topically 2 (two) times daily as needed. Rash. Do not use on the face, neck, armpits or groin area. Do not use more than 3 weeks in a row. 45 g 2  . Cetirizine HCl 10 MG CAPS Take 1 capsule (10 mg total) by mouth daily for 10 days. 10 capsule 0  . gabapentin (NEURONTIN) 100 MG capsule Take 2 capsules (200 mg total) by mouth every 12 (twelve) hours. (Patient not taking: Reported on 12/13/2020) 20 capsule 0  . ibuprofen (ADVIL) 800 MG tablet Take 1 tablet (800 mg total) by mouth 3 (three) times daily. (Patient not taking: Reported on 12/13/2020) 21 tablet 0   No current facility-administered medications for this visit.   Allergies: Allergies  Allergen Reactions  . Sulfa Antibiotics Swelling   Social History: Social History   Socioeconomic History  . Marital status: Single    Spouse name: Not on file  . Number of children: Not on file  . Years of education: Not on file  . Highest education level: Not on file  Occupational History  . Not on file  Tobacco Use  . Smoking status: Former Smoker    Years: 2.00     Types: Cigars    Quit date: 06/23/2019    Years since quitting: 1.4  . Smokeless tobacco: Never Used  Vaping Use  . Vaping Use: Never used  Substance and Sexual Activity  . Alcohol use: No    Alcohol/week: 0.0 standard drinks  . Drug use: No  . Sexual activity: Yes    Birth control/protection: I.U.D.  Other Topics Concern  . Not on file  Social History Narrative  . Not on file   Social Determinants of Health   Financial Resource Strain: Not on file  Food Insecurity: Not on file  Transportation Needs: Not on file  Physical Activity: Not on file  Stress: Not on file  Social Connections: Not on file   Lives in a 34 year old home. Smoking: denies Occupation: Acupuncturist HistorySurveyor, minerals in the house: no Carpet in the family room: yes Carpet in the bedroom: yes Heating: gas Cooling: central  Pet: no  Family History: Family History  Problem Relation Age of Onset  . Arthritis Mother   . Cancer Mother   . Hypertension Mother   . Diabetes Sister   . Thyroid disease Sister   . Hypertension Sister   . Asthma Sister   . Eczema Maternal Aunt    Review of Systems  Constitutional: Negative for appetite change, chills, fever and unexpected weight change.  HENT: Negative for congestion and rhinorrhea.   Eyes: Negative for itching.  Respiratory: Negative for cough, chest tightness, shortness of breath and wheezing.   Cardiovascular: Negative for chest pain.  Gastrointestinal: Negative for abdominal pain.  Genitourinary: Negative for difficulty urinating.  Skin: Positive for rash.  Neurological: Negative for headaches.   Objective: Temp 99.1 F (37.3 C)   Ht 5\' 5"  (1.651 m)   Wt 229 lb (103.9 kg)   LMP 11/24/2020 (Approximate)   BMI 38.11 kg/m  Body mass index is 38.11 kg/m. Physical Exam Vitals and nursing note reviewed.  Constitutional:      Appearance: Normal appearance. She is well-developed.  HENT:     Head:  Normocephalic and atraumatic.     Right Ear: External ear normal.     Left Ear: External ear normal.  Eyes:     Conjunctiva/sclera: Conjunctivae normal.  Cardiovascular:     Rate and Rhythm: Normal rate and regular rhythm.     Heart sounds: Normal heart sounds. No murmur heard. No friction rub. No gallop.   Pulmonary:     Effort: Pulmonary effort is normal.     Breath sounds: Normal breath sounds. No wheezing, rhonchi or rales.  Musculoskeletal:     Cervical back: Neck supple.  Skin:    General: Skin is warm.     Findings: Rash present.     Comments: A few scattered faint erythematous patches on abdominal area.   Neurological:     Mental Status: She is alert and oriented to person, place, and time.  Psychiatric:        Behavior: Behavior normal.    The plan was reviewed with the patient/family, and all questions/concerned were addressed.  It was my pleasure to see Kathryn Harvey today and participate in her care. Please feel free to contact me with any questions or concerns.  Sincerely,  Mick Sell, DO Allergy & Immunology  [facility] office: [phone] [facility] office: [phone]

## 2021-01-19 ENCOUNTER — Other Ambulatory Visit: Payer: Self-pay

## 2021-01-19 ENCOUNTER — Encounter: Payer: No Typology Code available for payment source | Attending: General Surgery | Admitting: Skilled Nursing Facility1

## 2021-01-19 DIAGNOSIS — E669 Obesity, unspecified: Secondary | ICD-10-CM | POA: Diagnosis present

## 2021-01-19 NOTE — Progress Notes (Signed)
Follow-up visit:  Post-Operative sleeve Surgery  Medical Nutrition Therapy:  Appt start time: 6:00pm end time:  7:00pm  Primary concerns today: Post-operative Bariatric Surgery Nutrition Management 6 Month Post-Op Class  Anthropometrics  Start weight at [facility]: 285.7 lbs (date: 12/24/2019) Today's weight: 223.6 lbs  Body Composition Scale 05/04/2020 11/17/2020 01/19/2021  Weight  lbs 256.5 236.6 223.6  BMI 42.3 39 36.9  Total Body Fat  % 44.4 42.3 40.7     Visceral Fat 13 12 11   Fat-Free Mass  % 55.5 57.6 59.2     Total Body Water  % 42.2 43.3 44.1     Muscle-Mass  lbs 33.8 33.6 36.9  Body Fat Displacement ---           Torso  lbs 70.6 62 56.4         Left Leg  lbs 14.1 12.4 11.2         Right Leg  lbs 14.1 12.4 11.2         Left Arm  lbs 7 6.2 5.6         Right Arm  lbs 7 6.2 5.6    Pt states she has been doing intermittent fasting: eating from 8am-5-6pm. Pt states she does struggle with feeling good about the changes he has made trying to work on that and say positive things to herself.   24 hr recall: First meal: protein shake Snack: eggs + chicken + peppers onion tomatoes Lunch: chicken salad + lettuce + tomatoes Snack: protein bar or skinny popcorn Dinner: chicken + spinach or zucchini  Snack: sugar free popcicle or yogurt  Beverage: plain water, water + flavorings, 8 ounces coffee + mirilax + sugar free syrup + cinnomon  Fluid intake: 100 ounces  Medications: See List Supplementation: 20-30 minutes cardio, 120 sit ups, A-Wall: 5 days a week  Using straws: no Drinking while eating: no Having you been chewing well: yes Chewing/swallowing difficulties: no Changes in vision: no Changes to mood/headaches: no Hair loss/Cahnges to skin/Changes to nails: no Any difficulty focusing or concentrating: no Sweating: no Dizziness/Lightheaded: no Palpitations: no  Carbonated beverages: no N/V/D/C/GAS: no Abdominal Pain: no Dumping syndrome: no  Progress Towards Goal(s):   In Progress Teaching method utilized: Visual & Auditory  Demonstrated degree of understanding via: Teach Back  Readiness Level: Action Barriers to learning/adherence to lifestyle change: none identified  Goals: Be sure to have a minimum 50 g CHO per day using fruit and other complex carbohydrates as your sources   Teaching Method Utilized:  Visual Auditory Hands on  Demonstrated degree of understanding via:  Teach Back   Monitoring/Evaluation:  Dietary intake, exercise, and body weight. Follow up for one year

## 2021-01-21 IMAGING — RF DG UGI W SINGLE CM
12 of 18 series · 13 of 24 positions shown · non-contrast
Comparison: None.

CLINICAL DATA: Preoperative evaluation for bariatric surgery.

EXAM:
UPPER GI SERIES WITH KUB
TECHNIQUE: After obtaining a scout radiograph a routine upper GI series was
performed using thin barium.
FLUOROSCOPY TIME:  Fluoroscopy Time: 1 minutes and 18 seconds of
low-dose pulsed fluoroscopy.
Radiation Exposure Index (if provided by the fluoroscopic device):
59.8 mGy
Number of Acquired Spot Images: 1 scout image. One spot image.

[Series 2: t abdomen supine · 0.15mm/px · 1 of 1 slices shown]
[im 1/1]
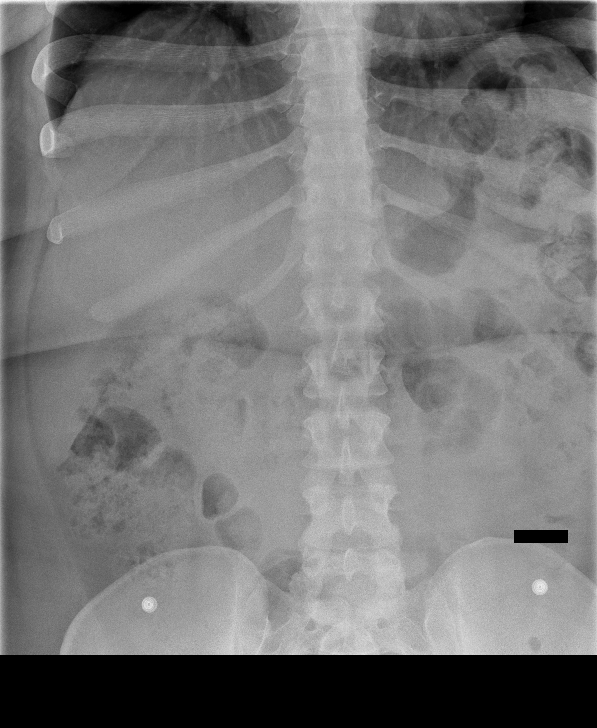

[Series 3: cp_standard · 0.55mm/px · 1 of 63 frames shown (1 of 10)]
[frame 32/63]
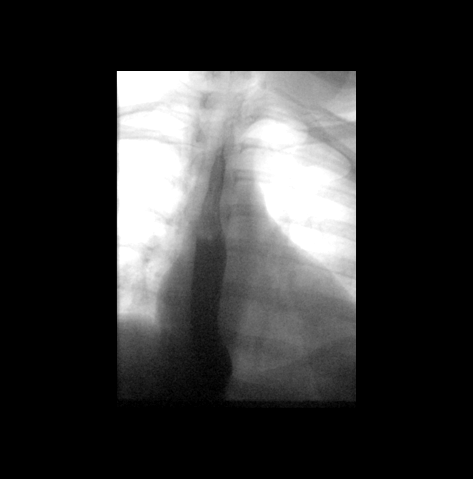

[Series 4: cp_standard · 0.55mm/px · 2 of 77 frames shown (2 of 10)]
[frame 3/77]
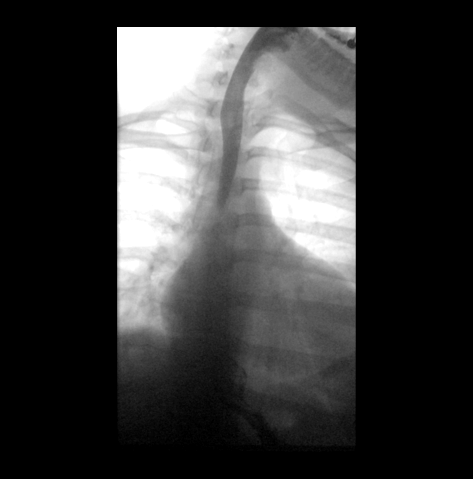
[frame 66/77]
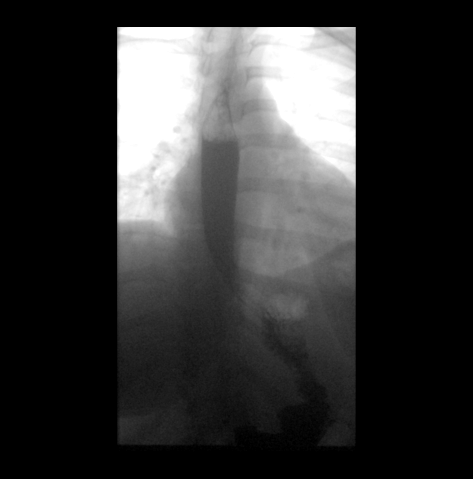

[Series 5: cp_standard · 0.59mm/px · 1 of 52 frames shown (3 of 10)]
[frame 27/52]
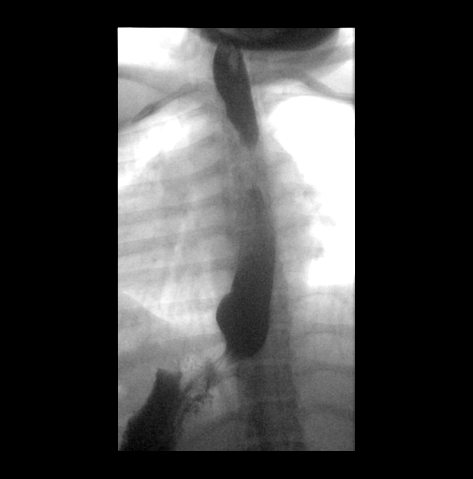

[Series 6: cp_standard · 0.30mm/px · 1 of 1 slices shown (4 of 10)]
[im 1/1]
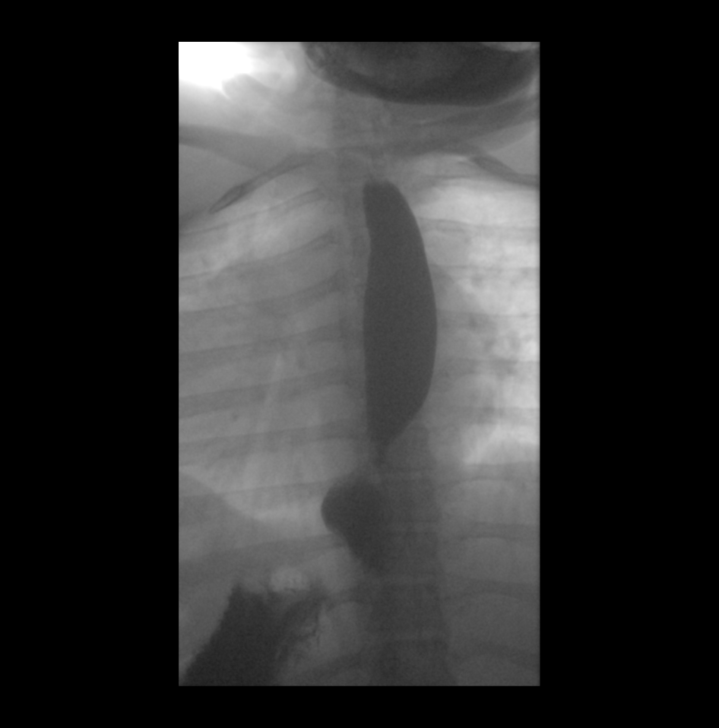

[Series 8: cp_standard · 0.30mm/px · 1 of 1 slices shown (5 of 10)]
[im 1/1]
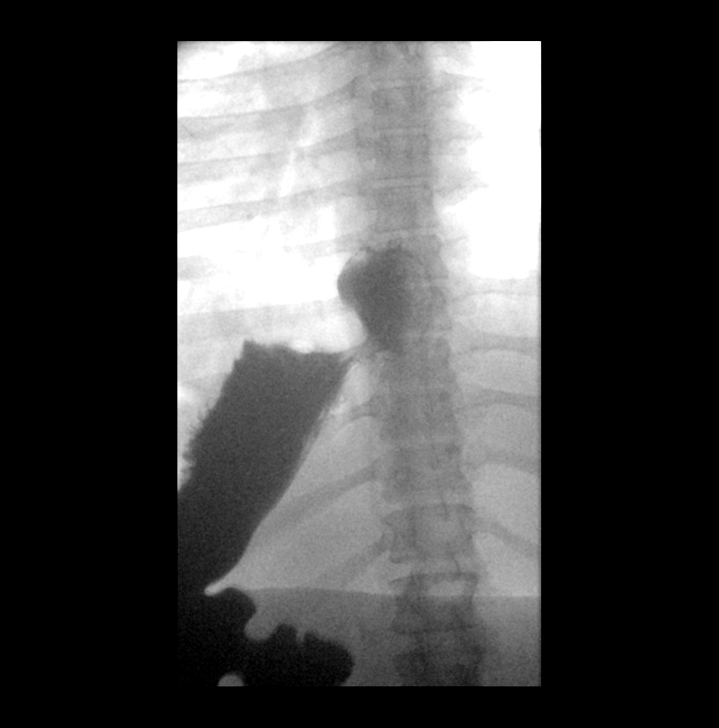

[Series 9: cp_standard · 0.20mm/px · 1 of 1 slices shown (6 of 10)]
[im 1/1]
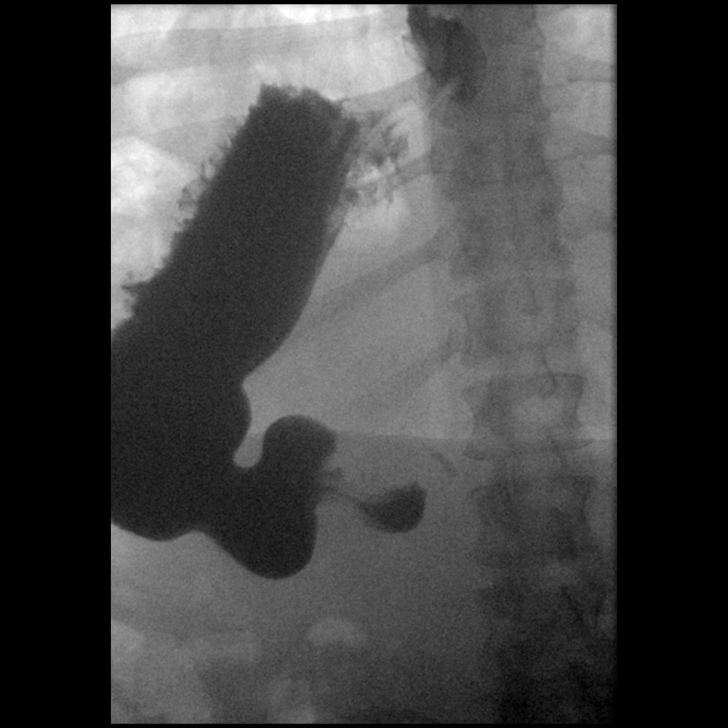

[Series 12: cp_standard · 0.20mm/px · 1 of 1 slices shown (7 of 10)]
[im 1/1]
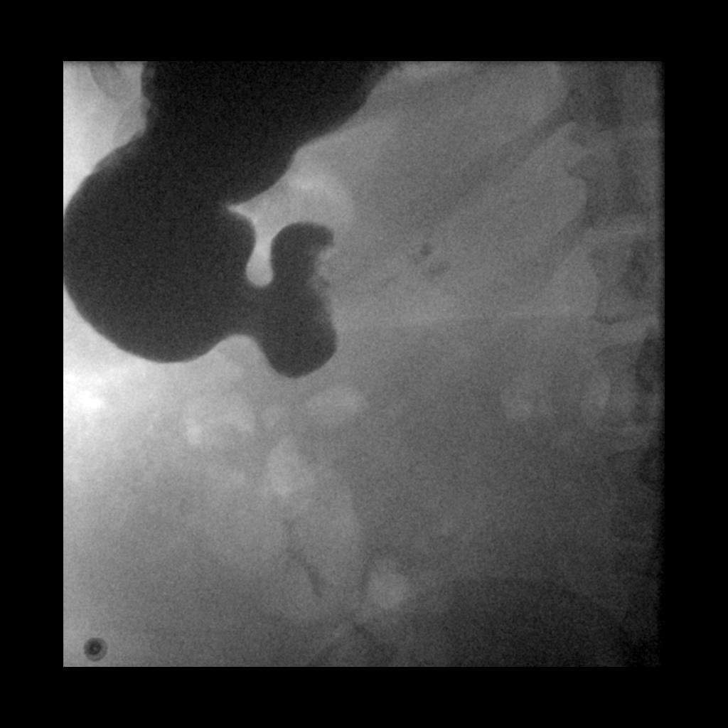

[Series 14: cp_standard · 0.18mm/px · 1 of 1 slices shown (8 of 10)]
[im 1/1]
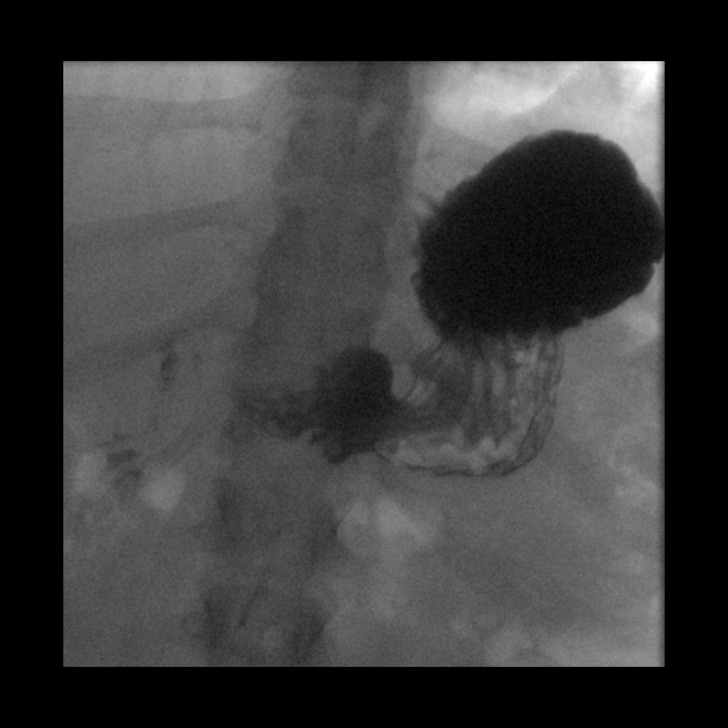

[Series 17: fluoro_barium singleshot_bw · 0.18mm/px · 1 of 1 slices shown]
[im 1/1]
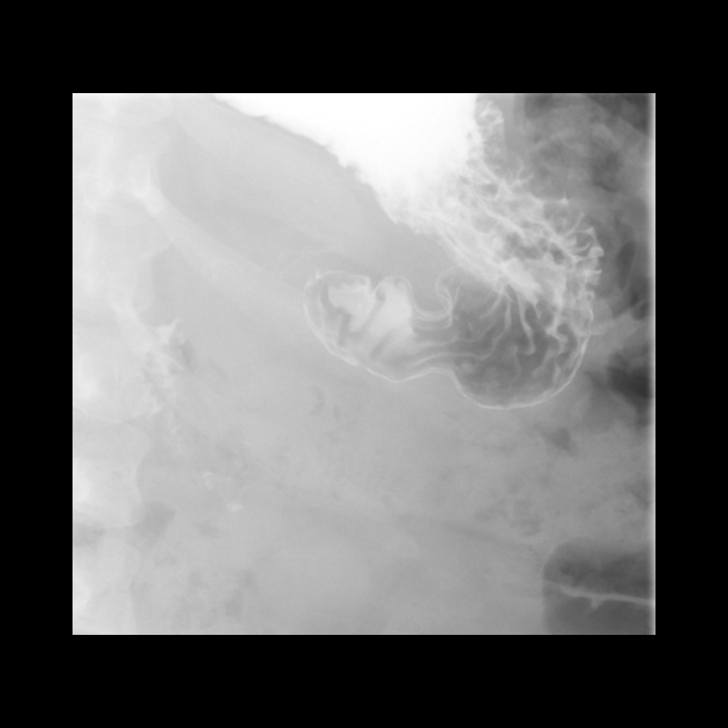

[Series 19: cp_standard · 0.18mm/px · 1 of 1 slices shown (9 of 10)]
[im 1/1]
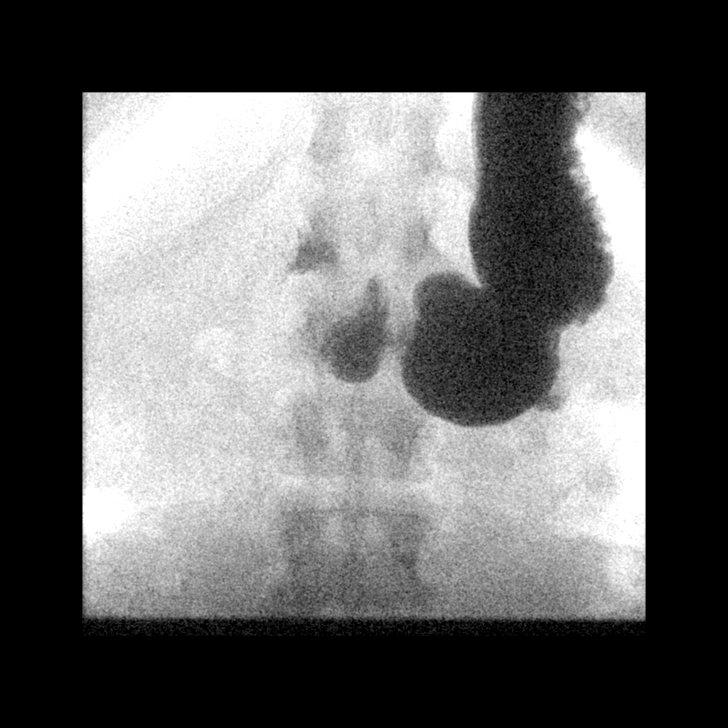

[Series 22: cp_standard · 0.18mm/px · 1 of 1 slices shown (10 of 10)]
[im 1/1]
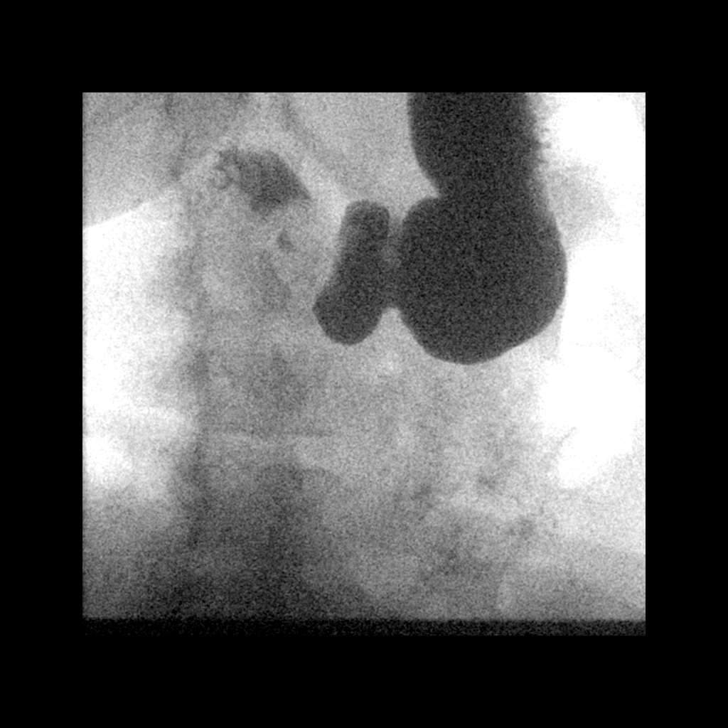

[13 of 24 positions shown; findings below may reference images not displayed]

FINDINGS: The scout abdominal radiograph is unremarkable.

The patient swallowed the barium without difficulty. The esophageal
motility is normal. There is no stricture, mass or hiatal hernia.
The stomach appears unremarkable.

There was initial delay in gastric emptying despite placing the
patient in the prone, right lateral decubitus and erect positions.
Eventually, there was adequate filling of the small bowel. The
duodenum appears normal. The ligament of Treitz is normally
positioned.
IMPRESSION: Normal upper GI series.

## 2021-02-03 ENCOUNTER — Other Ambulatory Visit: Payer: Self-pay

## 2021-02-03 ENCOUNTER — Ambulatory Visit (INDEPENDENT_AMBULATORY_CARE_PROVIDER_SITE_OTHER): Payer: No Typology Code available for payment source | Admitting: Family Medicine

## 2021-02-03 ENCOUNTER — Encounter (INDEPENDENT_AMBULATORY_CARE_PROVIDER_SITE_OTHER): Payer: Self-pay | Admitting: Family Medicine

## 2021-02-03 VITALS — BP 119/72 | HR 68 | Temp 98.7°F | Ht 65.0 in | Wt 218.0 lb

## 2021-02-03 DIAGNOSIS — Z1331 Encounter for screening for depression: Secondary | ICD-10-CM

## 2021-02-03 DIAGNOSIS — Z0289 Encounter for other administrative examinations: Secondary | ICD-10-CM

## 2021-02-03 DIAGNOSIS — D508 Other iron deficiency anemias: Secondary | ICD-10-CM

## 2021-02-03 DIAGNOSIS — R5383 Other fatigue: Secondary | ICD-10-CM | POA: Diagnosis not present

## 2021-02-03 DIAGNOSIS — Z9884 Bariatric surgery status: Secondary | ICD-10-CM | POA: Diagnosis not present

## 2021-02-03 DIAGNOSIS — Z9189 Other specified personal risk factors, not elsewhere classified: Secondary | ICD-10-CM | POA: Diagnosis not present

## 2021-02-03 DIAGNOSIS — R0602 Shortness of breath: Secondary | ICD-10-CM | POA: Diagnosis not present

## 2021-02-03 DIAGNOSIS — Z6836 Body mass index (BMI) 36.0-36.9, adult: Secondary | ICD-10-CM

## 2021-02-03 DIAGNOSIS — I10 Essential (primary) hypertension: Secondary | ICD-10-CM

## 2021-02-04 LAB — COMPREHENSIVE METABOLIC PANEL
ALT: 17 IU/L (ref 0–32)
AST: 15 IU/L (ref 0–40)
Albumin/Globulin Ratio: 1.9 (ref 1.2–2.2)
Albumin: 4.2 g/dL (ref 3.8–4.8)
Alkaline Phosphatase: 45 IU/L (ref 44–121)
BUN/Creatinine Ratio: 12 (ref 9–23)
BUN: 8 mg/dL (ref 6–20)
Bilirubin Total: 0.7 mg/dL (ref 0.0–1.2)
CO2: 21 mmol/L (ref 20–29)
Calcium: 9.6 mg/dL (ref 8.7–10.2)
Chloride: 106 mmol/L (ref 96–106)
Creatinine, Ser: 0.67 mg/dL (ref 0.57–1.00)
Globulin, Total: 2.2 g/dL (ref 1.5–4.5)
Glucose: 77 mg/dL (ref 65–99)
Potassium: 4.6 mmol/L (ref 3.5–5.2)
Sodium: 142 mmol/L (ref 134–144)
Total Protein: 6.4 g/dL (ref 6.0–8.5)
eGFR: 118 mL/min/{1.73_m2} (ref >59)

## 2021-02-04 LAB — CBC WITH DIFFERENTIAL/PLATELET
Basophils Absolute: 0 10*3/uL (ref 0.0–0.2)
Basos: 0 %
EOS (ABSOLUTE): 0.1 10*3/uL (ref 0.0–0.4)
Eos: 1 %
Hemoglobin: 11.9 g/dL (ref 11.1–15.9)
Immature Grans (Abs): 0 10*3/uL (ref 0.0–0.1)
Immature Granulocytes: 0 %
Lymphocytes Absolute: 4 10*3/uL — ABNORMAL HIGH (ref 0.7–3.1)
Lymphs: 42 %
MCH: 30.1 pg (ref 26.6–33.0)
MCHC: 32.6 g/dL (ref 31.5–35.7)
MCV: 92 fL (ref 79–97)
Monocytes Absolute: 0.6 10*3/uL (ref 0.1–0.9)
Monocytes: 6 %
Neutrophils Absolute: 4.9 10*3/uL (ref 1.4–7.0)
Neutrophils: 51 %
Platelets: 230 10*3/uL (ref 150–450)
RBC: 3.95 x10E6/uL (ref 3.77–5.28)
RDW: 12.6 % (ref 11.7–15.4)
WBC: 9.6 10*3/uL (ref 3.4–10.8)

## 2021-02-04 LAB — ANEMIA PANEL
Ferritin: 302 ng/mL — ABNORMAL HIGH (ref 15–150)
Folate, Hemolysate: 485 ng/mL
Folate, RBC: 1329 ng/mL (ref >498)
Hematocrit: 36.5 % (ref 34.0–46.6)
Iron Saturation: 35 % (ref 15–55)
Iron: 96 ug/dL (ref 27–159)
Retic Ct Pct: 1.2 % (ref 0.6–2.6)
Total Iron Binding Capacity: 277 ug/dL (ref 250–450)
UIBC: 181 ug/dL (ref 131–425)
Vitamin B-12: 477 pg/mL (ref 232–1245)

## 2021-02-04 LAB — LIPID PANEL WITH LDL/HDL RATIO
Cholesterol, Total: 185 mg/dL (ref 100–199)
HDL: 44 mg/dL (ref >39)
LDL Chol Calc (NIH): 130 mg/dL — ABNORMAL HIGH (ref 0–99)
LDL/HDL Ratio: 3 ratio (ref 0.0–3.2)
Triglycerides: 58 mg/dL (ref 0–149)
VLDL Cholesterol Cal: 11 mg/dL (ref 5–40)

## 2021-02-04 LAB — INSULIN, RANDOM: INSULIN: 1.4 u[IU]/mL — ABNORMAL LOW (ref 2.6–24.9)

## 2021-02-04 LAB — T4: T4, Total: 6.2 ug/dL (ref 4.5–12.0)

## 2021-02-04 LAB — T3: T3, Total: 107 ng/dL (ref 71–180)

## 2021-02-04 LAB — HEMOGLOBIN A1C
Est. average glucose Bld gHb Est-mCnc: 97 mg/dL
Hgb A1c MFr Bld: 5 % (ref 4.8–5.6)

## 2021-02-04 LAB — VITAMIN D 25 HYDROXY (VIT D DEFICIENCY, FRACTURES): Vit D, 25-Hydroxy: 51 ng/mL (ref 30.0–100.0)

## 2021-02-04 LAB — TSH: TSH: 0.946 u[IU]/mL (ref 0.450–4.500)

## 2021-02-08 NOTE — Progress Notes (Signed)
Chief Complaint:   OBESITY Kathryn Harvey (MR# [id number]) is a 35 y.o. female who presents for evaluation and treatment of obesity and related comorbidities. Current BMI is Body mass index is 36.28 kg/m. Kathryn Harvey has been struggling with her weight for many years and has been unsuccessful in either losing weight, maintaining weight loss, or reaching her healthy weight goal.  Kathryn Harvey is currently in the action stage of change and ready to dedicate time achieving and maintaining a healthier weight. Kathryn Harvey is interested in becoming our patient and working on intensive lifestyle modifications including (but not limited to) diet and exercise for weight loss.  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 Kathryn Harvey had a gastric sleeve in April 2021. Wake up in evening- 1 sandwich bag popcorn and 1/4 cup peanuts and protein shake (Premier) (satisfied); Exercise session x2; oatmeal packet with blueberries + cinnamon + 1 whole egg + 2 egg whites (feel full); Chicken (25 g) + 4 oz spinach or 4 oz zucchini (feel full); occasional snack of protein bar (220 calories).  Kathryn Harvey's habits were reviewed today and are as follows: Her family eats meals together, she thinks her family will eat healthier with her, her desired weight loss is 28 lbs, she has been heavy most of her life, she started gaining weight after childbirth, her heaviest weight ever was 319 pounds, she is a picky eater and doesn't like to eat healthier foods, she has significant food cravings issues, she is frequently drinking liquids with calories, she frequently makes poor food choices and she struggles with emotional eating.  Depression Screen Kathryn Harvey's Food and Mood (modified PHQ-9) score was 3.  Depression screen [name] 2/9 02/03/2021  Decreased Interest 0  Down, Depressed, Hopeless 1  PHQ - 2 Score 1  Altered sleeping 0  Tired, decreased energy 1  Change in appetite 1  Feeling bad or failure about yourself  0  Trouble concentrating 0  Moving  slowly or fidgety/restless 0  Suicidal thoughts 0  PHQ-9 Score 3  Difficult doing work/chores Not difficult at all   Subjective:   1. Other fatigue Kathryn Harvey denies daytime somnolence and denies waking up still tired. Patent has a history of symptoms of none. Morning generally gets 6-8 hours of sleep per night, and states that she has generally restful sleep. Snoring is present. Apneic episodes are not present. Epworth Sleepiness Score is 2. EKG normal sinus rhythm at 64 bpm.  2. SOB (shortness of breath) on exertion Kathryn Harvey notes increasing shortness of breath with exercising and seems to be worsening over time with weight gain. She notes getting out of breath sooner with activity than she used to. This has not gotten worse recently. Kathryn Harvey denies shortness of breath at rest or orthopnea. EKG normal sinus rhythm at 64 bpm.  3. Essential hypertension  Pt denies chest pain, chest pressure and headache. She was diagnosed with hypertension in 2016. Her BP is controlled today. She is on losartan.  BP Readings from Last 3 Encounters:  02/03/21 119/72  12/01/20 114/70  10/03/20 115/77    4. History of bariatric surgery Pt is on iron, calcium, and a multivitamin. She does report fatigue. She is still experiencing significant restriction.  5. Other iron deficiency anemia Pt is on daily iron supplement. She reports fatigue.  6. At risk for deficient intake of food Kathryn Harvey is at risk for deficient food intake due to history of bariatric surgery.  Assessment/Plan:   1. Other fatigue Kathryn Harvey does feel that her weight is causing her  energy to be lower than it should be. Fatigue may be related to obesity, depression or many other causes. Labs will be ordered, and in the meanwhile, Kathryn Harvey will focus on self care including making healthy food choices, increasing physical activity and focusing on stress reduction. Check labs today.  - EKG 12-Lead - Hemoglobin A1c - Insulin, random -  Lipid Panel With LDL/HDL Ratio - T3 - T4 - TSH - VITAMIN D 25 Hydroxy (Vit-D Deficiency, Fractures)  2. SOB (shortness of breath) on exertion Kathryn Harvey does not feel that she gets out of breath more easily that she used to when she exercises. Kathryn Harvey's shortness of breath appears to be obesity related and exercise induced. She has agreed to work on weight loss and gradually increase exercise to treat her exercise induced shortness of breath. Will continue to monitor closely.  3. Essential hypertension Kathryn Harvey is working on healthy weight loss and exercise to improve blood pressure control. We will watch for signs of hypotension as she continues her lifestyle modifications. Check labs today.  - Comprehensive metabolic panel  4. History of bariatric surgery Check labs today.  - Prealbumin  5. Other iron deficiency anemia The diagnosis was reviewed with the patient. Counseling provided today, see below. We will continue to monitor. Orders and follow up as documented in patient record. Anemia panel today.  Counseling . The body needs vitamin B12: to make red blood cells; to make DNA; and to help the nerves work properly so they can carry messages from the brain to the body.  . The main causes of vitamin B12 deficiency include dietary deficiency, digestive diseases, pernicious anemia, and having a surgery in which part of the stomach or small intestine is removed.  . Certain medicines can make it harder for the body to absorb vitamin B12. These medicines include: heartburn medications; some antibiotics; some medications used to treat diabetes, gout, and high cholesterol.  . In some cases, there are no symptoms of this condition. If the condition leads to anemia or nerve damage, various symptoms can occur, such as weakness or fatigue, shortness of breath, and numbness or tingling in your hands and feet.   . Treatment:  o May include taking vitamin B12 supplements.  o Avoid alcohol.  o Eat  lots of healthy foods that contain vitamin B12: - Beef, pork, chicken, [place], and organ meats, such as liver.  - Seafood: This includes clams, rainbow trout, salmon, tuna, and haddock. Eggs.  - Cereal and dairy products that are fortified: This means that vitamin B12 has been added to the food.   - CBC with Differential/Platelet - Vitamin B12 - Anemia panel  6. Depression screening Dilan had a negative depression screening. Depression is commonly associated with obesity and often results in emotional eating behaviors. We will monitor this closely and work on CBT to help improve the non-hunger eating patterns. Referral to Psychology may be required if no improvement is seen as she continues in our clinic.  7. At risk for deficient intake of food Kathryn Harvey was given approximately 15 minutes of deficit intake of food prevention counseling today. Kathryn Harvey is at risk for eating too few calories based on current food recall. She was encouraged to focus on meeting caloric and protein goals according to her recommended meal plan.   8. Class 2 severe obesity with serious comorbidity and body mass index (BMI) of 36.0 to 36.9 in adult, unspecified obesity type Kathryn Harvey) Kathryn Harvey is currently in the action stage of change and her goal  is to continue with weight loss efforts. I recommend Kathryn Harvey begin the structured treatment plan as follows:  She has agreed to the Category 1 Plan.  Exercise goals: No exercise has been prescribed at this time.   Behavioral modification strategies: increasing lean protein intake, meal planning and cooking strategies, keeping healthy foods in the home and planning for success.  She was informed of the importance of frequent follow-up visits to maximize her success with intensive lifestyle modifications for her multiple Harvey conditions. She was informed we would discuss her lab results at her next visit unless there is a critical issue that needs to be addressed sooner.  Kathryn Harvey agreed to keep her next visit at the agreed upon time to discuss these results.  Objective:   Blood pressure 119/72, pulse 68, temperature 98.7 F (37.1 C), temperature source Oral, height 5\' 5"  (1.651 m), weight 218 lb (98.9 kg), last menstrual period 01/28/2021, SpO2 100 %. Body mass index is 36.28 kg/m.  EKG: Normal sinus rhythm, rate 64.  Indirect Calorimeter completed today shows a VO2 of 157 and a REE of 1093.  Her calculated basal metabolic rate is 01/30/2021 thus her basal metabolic rate is worse than expected.  General: Cooperative, alert, well developed, in no acute distress. HEENT: Conjunctivae and lids unremarkable. Cardiovascular: Regular rhythm.  Lungs: Normal work of breathing. Neurologic: No focal deficits.   Lab Results  Component Value Date   CREATININE 0.67 02/03/2021   BUN 8 02/03/2021   NA 142 02/03/2021   K 4.6 02/03/2021   CL 106 02/03/2021   CO2 21 02/03/2021   Lab Results  Component Value Date   ALT 17 02/03/2021   AST 15 02/03/2021   ALKPHOS 45 02/03/2021   BILITOT 0.7 02/03/2021   Lab Results  Component Value Date   HGBA1C 5.0 02/03/2021   Lab Results  Component Value Date   INSULIN 1.4 (L) 02/03/2021   Lab Results  Component Value Date   TSH 0.946 02/03/2021   Lab Results  Component Value Date   CHOL 185 02/03/2021   HDL 44 02/03/2021   LDLCALC 130 (H) 02/03/2021   TRIG 58 02/03/2021   Lab Results  Component Value Date   WBC 9.6 02/03/2021   HGB 11.9 02/03/2021   HCT 36.5 02/03/2021   MCV 92 02/03/2021   PLT 230 02/03/2021   Lab Results  Component Value Date   IRON 96 02/03/2021   TIBC 277 02/03/2021   FERRITIN 302 (H) 02/03/2021    Attestation Statements:   Reviewed by clinician on day of visit: allergies, medications, problem list, medical history, surgical history, family history, social history, and previous encounter notes.  This is the patient's first visit at [facility]. The patient's NEW  PATIENT PACKET was reviewed at length. Included in the packet: current and past Harvey history, medications, allergies, ROS, gynecologic history (women only), surgical history, family history, social history, weight history, weight loss surgery history (for those that have had weight loss surgery), nutritional evaluation, mood and food questionnaire, PHQ9, Epworth questionnaire, sleep habits questionnaire, patient life and Harvey improvement goals questionnaire. These will all be scanned into the patient's chart under media.   During the visit, I independently reviewed the patient's EKG, bioimpedance scale results, and indirect calorimeter results. I used this information to tailor a meal plan for the patient that will help her to lose weight and will improve her obesity-related conditions going forward. I performed a medically necessary appropriate examination and/or evaluation. I discussed  the assessment and treatment plan with the patient. The patient was provided an opportunity to ask questions and all were answered. The patient agreed with the plan and demonstrated an understanding of the instructions. Labs were ordered at this visit and will be reviewed at the next visit unless more critical results need to be addressed immediately. Clinical information was updated and documented in the EMR.   Time spent on visit including pre-visit chart review and post-visit care was 45 minutes.   A separate 15 minutes was spent on risk counseling (see above).    Edmund HildaI, Tamesha Frazier, am acting as transcriptionist for Reuben LikesAlexandria Ardyn Forge, MD.   I have reviewed the above documentation for accuracy and completeness, and I agree with the above. - Katherina MiresAlexandria Zylon Creamer Kadolph, MD

## 2021-02-17 ENCOUNTER — Other Ambulatory Visit: Payer: Self-pay

## 2021-02-17 ENCOUNTER — Encounter (INDEPENDENT_AMBULATORY_CARE_PROVIDER_SITE_OTHER): Payer: Self-pay | Admitting: Family Medicine

## 2021-02-17 ENCOUNTER — Ambulatory Visit (INDEPENDENT_AMBULATORY_CARE_PROVIDER_SITE_OTHER): Payer: No Typology Code available for payment source | Admitting: Family Medicine

## 2021-02-17 VITALS — BP 100/51 | HR 71 | Temp 98.4°F | Ht 65.0 in | Wt 212.0 lb

## 2021-02-17 DIAGNOSIS — R21 Rash and other nonspecific skin eruption: Secondary | ICD-10-CM | POA: Diagnosis not present

## 2021-02-17 DIAGNOSIS — D508 Other iron deficiency anemias: Secondary | ICD-10-CM

## 2021-02-17 DIAGNOSIS — Z9189 Other specified personal risk factors, not elsewhere classified: Secondary | ICD-10-CM | POA: Diagnosis not present

## 2021-02-17 DIAGNOSIS — Z6836 Body mass index (BMI) 36.0-36.9, adult: Secondary | ICD-10-CM

## 2021-02-17 MED ORDER — TRIAMCINOLONE ACETONIDE 0.1 % EX CREA
1.0000 | TOPICAL_CREAM | Freq: Two times a day (BID) | CUTANEOUS | 2 refills | Status: DC | PRN
Start: 2021-02-17 — End: 2022-08-30

## 2021-02-21 NOTE — Progress Notes (Signed)
Chief Complaint:   OBESITY Kathryn Harvey is here to discuss her progress with her obesity treatment plan along with follow-up of her obesity related diagnoses. Kathryn Harvey is on the Category 1 Plan and states she is following her eating plan approximately 90-95% of the time. Kathryn Harvey states she is doing AWOL and cardio 30-40 minutes 5 times per week.  Today's visit was #: 2 Starting weight: 218 lbs Starting date: 02/03/2021 Today's weight: 212 lbs Today's date: 02/17/2021 Total lbs lost to date: 6 lbs Total lbs lost since last in-office visit: 6 lbs  Interim History: Kathryn Harvey is eating breakfast at work before exercise. She is getting hungry ~ 2 hours after meal. 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 Pt voices that she is burning ~400 calories each time she exercises. She has no plans for the upcoming few weeks.  Subjective:   1. Rash and other nonspecific skin eruption Kathryn Harvey has a rash in the skin folds of her abdomen. It is improved with kenalog.  2. Other iron deficiency anemia Kathryn Harvey's ferritin was elevated at 302, iron 96, and MCV 92. She is on a multivitamin and iron supplement.    3. At risk for activity intolerance Kathryn Harvey is at risk for exercise intolerance due to skin infection.  Assessment/Plan:   1. Rash and other nonspecific skin eruption Refill Kenalog 0.1%, as per below.  - triamcinolone (KENALOG) 0.1 %; Apply 1 application topically 2 (two) times daily as needed. Rash. Do not use on the face, neck, armpits or groin area. Do not use more than 3 weeks in a row.  Dispense: 45 g; Refill: 2  2. Other iron deficiency anemia Discontinue iron supplement.  3. At risk for activity intolerance Kathryn Harvey was given approximately 15 minutes of exercise intolerance counseling today. She is 35 y.o. female and has risk factors exercise intolerance including obesity. We discussed intensive lifestyle modifications today with an emphasis on specific weight  loss instructions and strategies. Kathryn Harvey will slowly increase activity as tolerated.  Repetitive spaced learning was employed today to elicit superior memory formation and behavioral change.  4. Class 2 severe obesity with serious comorbidity and body mass index (BMI) of 36.0 to 36.9 in adult, unspecified obesity type [facility]) [name] is currently in the action stage of change. As such, her goal is to continue with weight loss efforts. She has agreed to the Category 2 Plan + 100 calories.   Exercise goals: No exercise has been prescribed at this time.  Behavioral modification strategies: increasing lean protein intake, meal planning and cooking strategies, keeping healthy foods in the home and planning for success.  Kathryn Harvey has agreed to follow-up with our clinic in 2 weeks. She was informed of the importance of frequent follow-up visits to maximize her success with intensive lifestyle modifications for her multiple health conditions.   Objective:   Blood pressure (!) 100/51, pulse 71, temperature 98.4 F (36.9 C), temperature source Oral, height 5\' 5"  (1.651 m), weight 212 lb (96.2 kg), last menstrual period 01/28/2021, SpO2 98 %. Body mass index is 35.28 kg/m.  General: Cooperative, alert, well developed, in no acute distress. HEENT: Conjunctivae and lids unremarkable. Cardiovascular: Regular rhythm.  Lungs: Normal work of breathing. Neurologic: No focal deficits.   Lab Results  Component Value Date   CREATININE 0.67 02/03/2021   BUN 8 02/03/2021   NA 142 02/03/2021   K 4.6 02/03/2021   CL 106 02/03/2021   CO2 21 02/03/2021   Lab Results  Component Value Date   ALT 17 02/03/2021   AST 15 02/03/2021   ALKPHOS 45 02/03/2021   BILITOT 0.7 02/03/2021   Lab Results  Component Value Date   HGBA1C 5.0 02/03/2021   Lab Results  Component Value Date   INSULIN 1.4 (L) 02/03/2021   Lab Results  Component Value Date   TSH 0.946 02/03/2021   Lab Results  Component Value  Date   CHOL 185 02/03/2021   HDL 44 02/03/2021   LDLCALC 130 (H) 02/03/2021   TRIG 58 02/03/2021   Lab Results  Component Value Date   WBC 9.6 02/03/2021   HGB 11.9 02/03/2021   HCT 36.5 02/03/2021   MCV 92 02/03/2021   PLT 230 02/03/2021   Lab Results  Component Value Date   IRON 96 02/03/2021   TIBC 277 02/03/2021   FERRITIN 302 (H) 02/03/2021    Attestation Statements:   Reviewed by clinician on day of visit: allergies, medications, problem list, medical history, surgical history, family history, social history, and previous encounter notes.  Kathryn Harvey, am acting as transcriptionist for Kathryn Likes, MD.  I have reviewed the above documentation for accuracy and completeness, and I agree with the above. - Kathryn Mires, MD

## 2021-03-07 ENCOUNTER — Other Ambulatory Visit: Payer: Self-pay

## 2021-03-07 ENCOUNTER — Ambulatory Visit (INDEPENDENT_AMBULATORY_CARE_PROVIDER_SITE_OTHER): Payer: No Typology Code available for payment source | Admitting: Family Medicine

## 2021-03-07 ENCOUNTER — Encounter (INDEPENDENT_AMBULATORY_CARE_PROVIDER_SITE_OTHER): Payer: Self-pay | Admitting: Family Medicine

## 2021-03-07 VITALS — BP 118/77 | HR 65 | Temp 98.4°F | Ht 65.0 in | Wt 212.0 lb

## 2021-03-07 DIAGNOSIS — Z6836 Body mass index (BMI) 36.0-36.9, adult: Secondary | ICD-10-CM

## 2021-03-07 DIAGNOSIS — I10 Essential (primary) hypertension: Secondary | ICD-10-CM | POA: Diagnosis not present

## 2021-03-07 MED ORDER — LOSARTAN POTASSIUM 50 MG PO TABS: 25.0000 mg | ORAL_TABLET | Freq: Every day | ORAL | 0 refills | Status: DC

## 2021-03-12 NOTE — Progress Notes (Signed)
Chief Complaint:   OBESITY Kathryn Harvey is here to discuss her progress with her obesity treatment plan along with follow-up of her obesity related diagnoses. Kathryn Harvey is on the Category 2 Plan + 100 calories and states she is following her eating plan approximately 85% of the time. Kathryn Harvey states she is doing cardio and HIIT 30-40 minutes 5 times per week.  Today's visit was #: 3 Starting weight: 218 lbs Starting date: 02/03/2021 Today's weight: 212 lbs Today's date: 03/07/2021 Total lbs lost to date: 6 lbs Total lbs lost since last in-office visit: 0  Interim History: Pt has been following meal plan ~85% of the time. She denies hunger or cravings. She is able to get in 8 oz at dinner. She is doing crackers, peanuts, almonds for snack. Pt does not foresee any obstacles in the next few weeks.  Subjective:   1. Essential hypertension Pt's BP is well controlled. She denies chest pain, chest pressure and headache. She is on Cozaar 50 mg.  BP Readings from Last 3 Encounters:  03/07/21 118/77  02/17/21 (!) 100/51  02/03/21 119/72   Assessment/Plan:   1. Essential hypertension Kathryn Harvey is working on healthy weight loss and exercise to improve blood pressure control. We will watch for signs of hypotension as she continues her lifestyle modifications. Decrease Cozaar to 25 mg, as per below.  - losartan (COZAAR) 50 MG tablet; Take 0.5 tablets (25 mg total) by mouth daily.  Dispense: 30 tablet; Refill: 0  2. Class 2 severe obesity with serious comorbidity and body mass index (BMI) of 36.0 to 36.9 in adult, unspecified obesity type [facility] Kathryn) Kathryn Harvey is currently in the action stage of change. As such, her goal is to continue with weight loss efforts. She has agreed to the Category 2 Plan + 100 calories.   Exercise goals: As is  Behavioral modification strategies: increasing lean protein intake, meal planning and cooking strategies, travel eating strategies and celebration eating  strategies.  Tahjanae has agreed to follow-up with our clinic in 3 weeks. She was informed of the importance of frequent follow-up visits to maximize her success with intensive lifestyle modifications for her multiple health conditions.   Objective:   Blood pressure 118/77, pulse 65, temperature 98.4 F (36.9 C), height 5\' 5"  (1.651 m), weight 212 lb (96.2 kg), SpO2 99 %. Body mass index is 35.28 kg/m.  General: Cooperative, alert, well developed, in no acute distress. HEENT: Conjunctivae and lids unremarkable. Cardiovascular: Regular rhythm.  Lungs: Normal work of breathing. Neurologic: No focal deficits.   Lab Results  Component Value Date   CREATININE 0.67 02/03/2021   BUN 8 02/03/2021   NA 142 02/03/2021   K 4.6 02/03/2021   CL 106 02/03/2021   CO2 21 02/03/2021   Lab Results  Component Value Date   ALT 17 02/03/2021   AST 15 02/03/2021   ALKPHOS 45 02/03/2021   BILITOT 0.7 02/03/2021   Lab Results  Component Value Date   HGBA1C 5.0 02/03/2021   Lab Results  Component Value Date   INSULIN 1.4 (L) 02/03/2021   Lab Results  Component Value Date   TSH 0.946 02/03/2021   Lab Results  Component Value Date   CHOL 185 02/03/2021   HDL 44 02/03/2021   LDLCALC 130 (H) 02/03/2021   TRIG 58 02/03/2021   Lab Results  Component Value Date   WBC 9.6 02/03/2021   HGB 11.9 02/03/2021   HCT 36.5 02/03/2021   MCV 92 02/03/2021   PLT  230 02/03/2021   Lab Results  Component Value Date   IRON 96 02/03/2021   TIBC 277 02/03/2021   FERRITIN 302 (H) 02/03/2021    Attestation Statements:   Reviewed by clinician on day of visit: allergies, medications, problem list, medical history, surgical history, family history, social history, and previous encounter notes.  Time spent on visit including pre-visit chart review and post-visit care and charting was 15 minutes.   Edmund Hilda, am acting as transcriptionist for Reuben Likes, MD.   I have reviewed the  above documentation for accuracy and completeness, and I agree with the above. - Katherina Mires, MD

## 2021-03-14 ENCOUNTER — Encounter: Payer: No Typology Code available for payment source | Attending: General Surgery | Admitting: Skilled Nursing Facility1

## 2021-03-14 ENCOUNTER — Other Ambulatory Visit: Payer: Self-pay

## 2021-03-14 DIAGNOSIS — E669 Obesity, unspecified: Secondary | ICD-10-CM | POA: Diagnosis not present

## 2021-03-14 NOTE — Progress Notes (Signed)
Follow-up visit:  Post-Operative sleeve Surgery   Primary concerns today: Post-operative Bariatric Surgery Nutrition Management 6 Month Post-Op Class  Anthropometrics  Start weight at [facility]: 285.7 lbs (date: 12/24/2019) Today's weight: 212.3 lbs  Body Composition Scale 05/04/2020 11/17/2020 01/19/2021 03/14/2021  Weight  lbs 256.5 236.6 223.6 212.3  BMI 42.3 39 36.9 35  Total Body Fat  % 44.4 42.3 40.7 39.8     Visceral Fat 13 12 11 10   Fat-Free Mass  % 55.5 57.6 59.2 60.6     Total Body Water  % 42.2 43.3 44.1 44.8     Muscle-Mass  lbs 33.8 33.6 36.9 33.2  Body Fat Displacement ---            Torso  lbs 70.6 62 56.4 51.7         Left Leg  lbs 14.1 12.4 11.2 10.3         Right Leg  lbs 14.1 12.4 11.2 10.3         Left Arm  lbs 7 6.2 5.6 5.1         Right Arm  lbs 7 6.2 5.6 5.1   Pt states she has been going to healthy weight and wellness. Pt states she wants to get down to 190 pounds.  RD educated pt on loose skin not being metabolically active  24 hr recall: First meal: 1 slice bread + cheese or yogurt or 2 boiled eggs Snack: 100 calorie almonds  Lunch: chicken salad + lettuce + tomatoes or sandwich + pear or frozen meals Snack: 100 calorie gluten free crackers or ice cream that is 100 calories or string cheese Dinner: chicken + spinach or zucchini + riced cauliflower + bruseel sprouts  Snack: sugar free popcicle or yogurt  Beverage: plain water, water + flavorings, 8 ounces coffee + mirilax + sugar free syrup + cinnomon  Fluid intake: 100 ounces  Medications: See List Supplementation: 40 minutes cardio, 120 sit ups, A-Wall 30 minutes: 5 days a week  Using straws: no Drinking while eating: no Having you been chewing well: yes Chewing/swallowing difficulties: no Changes in vision: no Changes to mood/headaches: no Hair loss/Cahnges to skin/Changes to nails: no Any difficulty focusing or concentrating: no Sweating: no Dizziness/Lightheaded: no Palpitations: no   Carbonated beverages: no N/V/D/C/GAS: no Abdominal Pain: no Dumping syndrome: no  Progress Towards Goal(s):  In Progress Teaching method utilized: Visual & Auditory  Demonstrated degree of understanding via: Teach Back  Readiness Level: Action Barriers to learning/adherence to lifestyle change: none identified  Goals: continue Be sure to have a minimum 50 g CHO per day using fruit and other complex carbohydrates as your sources  Reduce protein from 4-6 oz serving sizes to 3 oz at each eating episode  Teaching Method Utilized:  Visual Auditory Hands on  Demonstrated degree of understanding via:  Teach Back   Monitoring/Evaluation:  Dietary intake, exercise, and body weight.

## 2021-03-17 ENCOUNTER — Encounter (INDEPENDENT_AMBULATORY_CARE_PROVIDER_SITE_OTHER): Payer: Self-pay

## 2021-03-28 ENCOUNTER — Ambulatory Visit (INDEPENDENT_AMBULATORY_CARE_PROVIDER_SITE_OTHER): Payer: No Typology Code available for payment source | Admitting: Family Medicine

## 2021-04-14 ENCOUNTER — Other Ambulatory Visit: Payer: Self-pay

## 2021-04-14 ENCOUNTER — Encounter: Payer: No Typology Code available for payment source | Attending: General Surgery | Admitting: Skilled Nursing Facility1

## 2021-04-14 DIAGNOSIS — E669 Obesity, unspecified: Secondary | ICD-10-CM | POA: Diagnosis present

## 2021-04-14 NOTE — Progress Notes (Signed)
Follow-up visit:  Post-Operative sleeve Surgery  Anthropometrics  Start weight at [facility]: 285.7 lbs (date: 12/24/2019) Today's weight: 209 lbs  Body Composition Scale 05/04/2020 11/17/2020 01/19/2021 03/14/2021 04/14/2021  Weight  lbs 256.5 236.6 223.6 212.3 209  BMI 42.3 39 36.9 35 34.4  Total Body Fat  % 44.4 42.3 40.7 39.8 38.8     Visceral Fat 13 12 11 10 10   Fat-Free Mass  % 55.5 57.6 59.2 60.6 61.1     Total Body Water  % 42.2 43.3 44.1 44.8 45     Muscle-Mass  lbs 33.8 33.6 36.9 33.2 33.2  Body Fat Displacement ---             Torso  lbs 70.6 62 56.4 51.7 50.2         Left Leg  lbs 14.1 12.4 11.2 10.3 10         Right Leg  lbs 14.1 12.4 11.2 10.3 10         Left Arm  lbs 7 6.2 5.6 5.1 5         Right Arm  lbs 7 6.2 5.6 5.1 5   Pt states she wants to get down to 190 pounds.  RD educated pt on loose skin not being metabolically active: continued Pt states she is worried about gaining weight in the future.   24 hr recall: First meal: yogurt + oatmeal + blueberries or protein shake + oatmeal + bluberries Snack: rice crackers or 100 calorie ice cream or peanuts or popcorn or fruit Lunch:  frozen meals or chicken salad (lettuce, tomato, onion, green pepper, cheese + fruit Snack: 100 calorie gluten free crackers or ice cream that is 100 calories or string cheese Dinner: chicken + spinach or zucchini + riced cauliflower + bruseel sprouts  Snack: sugar free popcicle or yogurt  Beverage: plain water, water + flavorings, 8 ounces coffee + mirilax + sugar free syrup + cinnomon  Fluid intake: 100 ounces  Medications: See List Supplementation:  Physical activity: 40 minutes cardio, A-Wall 30 minutes, jogging 3 miles: 7 days a week; walking steps at work  Using straws: no Drinking while eating: no Having you been chewing well: yes Chewing/swallowing difficulties: no Changes in vision: no Changes to mood/headaches: no Hair loss/Cahnges to skin/Changes to nails: no Any  difficulty focusing or concentrating: no Sweating: no Dizziness/Lightheaded: no Palpitations: no  Carbonated beverages: no N/V/D/C/GAS: no Abdominal Pain: no Dumping syndrome: no  Progress Towards Goal(s):  In Progress Teaching method utilized: 99833 & Auditory  Demonstrated degree of understanding via: Teach Back  Readiness Level: Action Barriers to learning/adherence to lifestyle change: none identified  Goals:  -Be sure to limit eating to every 3 hours not in between  Teaching Method Utilized:  Visual Auditory Hands on  Demonstrated degree of understanding via:  Teach Back   Monitoring/Evaluation:  Dietary intake, exercise, and body weight.

## 2021-05-18 ENCOUNTER — Emergency Department (HOSPITAL_COMMUNITY)
Admission: EM | Admit: 2021-05-18 | Discharge: 2021-05-18 | Disposition: A | Discharge status: Home / Self Care | Payer: No Typology Code available for payment source | Attending: Emergency Medicine | Admitting: Emergency Medicine

## 2021-05-18 ENCOUNTER — Emergency Department (HOSPITAL_COMMUNITY): Payer: No Typology Code available for payment source

## 2021-05-18 DIAGNOSIS — S92422B Displaced fracture of distal phalanx of left great toe, initial encounter for open fracture: Secondary | ICD-10-CM | POA: Diagnosis not present

## 2021-05-18 DIAGNOSIS — I10 Essential (primary) hypertension: Secondary | ICD-10-CM | POA: Insufficient documentation

## 2021-05-18 DIAGNOSIS — W208XXA Other cause of strike by thrown, projected or falling object, initial encounter: Secondary | ICD-10-CM | POA: Diagnosis not present

## 2021-05-18 DIAGNOSIS — Y9239 Other specified sports and athletic area as the place of occurrence of the external cause: Secondary | ICD-10-CM | POA: Insufficient documentation

## 2021-05-18 DIAGNOSIS — Z87891 Personal history of nicotine dependence: Secondary | ICD-10-CM | POA: Insufficient documentation

## 2021-05-18 DIAGNOSIS — Z79899 Other long term (current) drug therapy: Secondary | ICD-10-CM | POA: Insufficient documentation

## 2021-05-18 DIAGNOSIS — S99922A Unspecified injury of left foot, initial encounter: Secondary | ICD-10-CM | POA: Diagnosis present

## 2021-05-18 MED ORDER — IBUPROFEN 800 MG PO TABS
800.0000 mg | ORAL_TABLET | Freq: Once | ORAL | Status: AC
Start: 2021-05-18 — End: 2021-05-18
  Administered 2021-05-18: 800 mg via ORAL
  Filled 2021-05-18: qty 1

## 2021-05-18 MED ORDER — CEFAZOLIN SODIUM-DEXTROSE 2-4 GM/100ML-% IV SOLN
2.0000 g | Freq: Once | INTRAVENOUS | Status: AC
  Administered 2021-05-18: 2 g via INTRAVENOUS
  Filled 2021-05-18: qty 100

## 2021-05-18 MED ORDER — CEPHALEXIN 500 MG PO CAPS: 500.0000 mg | ORAL_CAPSULE | Freq: Four times a day (QID) | ORAL | 0 refills | Status: DC

## 2021-05-18 MED ORDER — LIDOCAINE HCL 2 % IJ SOLN
10.0000 mL | Freq: Once | INTRAMUSCULAR | Status: AC
  Administered 2021-05-18: 200 mg via INTRADERMAL
  Filled 2021-05-18: qty 20

## 2021-05-18 MED ORDER — HYDROCODONE-ACETAMINOPHEN 5-325 MG PO TABS: 1.0000 | ORAL_TABLET | Freq: Four times a day (QID) | ORAL | 0 refills | Status: DC | PRN

## 2021-05-18 NOTE — ED Provider Notes (Addendum)
[facility] EMERGENCY DEPARTMENT Provider Note   CSN: [id number] Arrival date & time: 05/18/21  0557     History Chief Complaint  Patient presents with   Toe Injury    Kathryn Harvey is a 35 y.o. female.  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  She was in the gym on her break using weights when she had a 25 pound weight fall onto her left great toe.  Complaining of pain and bleeding to the toe.  Tetanus is up-to-date.  No other injuries or complaints.  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  The history is provided by the patient.  Toe Pain This is a new problem. The current episode started less than 1 hour ago. The problem occurs constantly. The problem has not changed since onset.The symptoms are aggravated by bending and standing. Nothing relieves the symptoms. She has tried nothing for the symptoms. The treatment provided no relief.      Past Medical History:  Diagnosis Date   Anxiety    Arthritis    Constipation    Eczema    GERD (gastroesophageal reflux disease)    Hypertension    Obesity    Osteoarthritis     Patient Active Problem List   Diagnosis Date Noted   Rash and other nonspecific skin eruption 12/13/2020   Essential hypertension 03/11/2020   Obesity 03/08/2020    Past Surgical History:  Procedure Laterality Date   LAPAROSCOPIC GASTRIC SLEEVE RESECTION N/A 03/08/2020   Procedure: LAPAROSCOPIC GASTRIC SLEEVE RESECTION, Upper Endo, ERAS Pathway;  Surgeon: Kinsinger, De Blanch, MD;  Location: [facility];  Service: General;  Laterality: N/A;   NO PAST SURGERIES       OB History     Gravida  2   Para      Term      Preterm      AB      Living         SAB      IAB      Ectopic      Multiple      Live Births              Family History  Problem Relation Age of Onset   Arthritis Mother    Cancer Mother    Hypertension Mother    Diabetes Sister    Thyroid disease Sister     Hypertension Sister    Asthma Sister    Eczema Maternal Aunt     Social History   Tobacco Use   Smoking status: Former    Pack years: 0.00    Types: Cigars    Quit date: 06/23/2019    Years since quitting: 1.9   Smokeless tobacco: Never  Vaping Use   Vaping Use: Never used  Substance Use Topics   Alcohol use: No    Alcohol/week: 0.0 standard drinks   Drug use: No    Home Medications Prior to Admission medications   Medication Sig Start Date End Date Taking? Authorizing Provider  Ascorbic Acid (VITAMIN C) 1000 MG tablet Take 1,000 mg by mouth daily.    [provider]  CALCIUM CITRATE PO Take 500 mg by mouth in the morning, at noon, and at bedtime.    [provider]  Cetirizine HCl 10 MG CAPS Take 1 capsule (10 mg total) by mouth daily for 10 days. 12/01/20 12/11/20  Wieters, Hallie C, PA-C  losartan (COZAAR) 50  MG tablet Take 0.5 tablets (25 mg total) by mouth daily. 03/07/21   Langston Reusing, MD  Multiple Vitamins-Minerals (BARIATRIC MULTIVITAMINS/IRON PO) Take 1 tablet by mouth in the morning and at bedtime.    [provider]  Multiple Vitamins-Minerals (HAIR/SKIN/NAILS/BIOTIN PO) Take 5,000 mg by mouth.    [provider]  PRESCRIPTION MEDICATION "Birth control"--unknown name    [provider]  triamcinolone (KENALOG) 0.1 % Apply 1 application topically 2 (two) times daily as needed. Rash. Do not use on the face, neck, armpits or groin area. Do not use more than 3 weeks in a row. 02/17/21   Langston Reusing, MD  Levonorgestrel (SKYLA) 13.5 MG IUD 13.5 mg by Intrauterine route daily.  10/03/20  [provider]  pantoprazole (PROTONIX) 40 MG tablet Take 1 tablet (40 mg total) by mouth daily. 03/09/20 10/03/20  Kinsinger, De Blanch, MD    Allergies    Sulfa antibiotics  Review of Systems   Review of Systems  Constitutional:  Negative for fever.  Skin:  Positive for wound.  Neurological:  Negative for weakness and  numbness.   Physical Exam Updated Vital Signs BP (!) 140/95 (BP Location: Right Arm)   Pulse 89   Temp 98.9 F (37.2 C) (Oral)   Resp 17   SpO2 100%   Physical Exam Constitutional:      Appearance: Normal appearance. She is well-developed.  HENT:     Head: Normocephalic and atraumatic.  Eyes:     Conjunctiva/sclera: Conjunctivae normal.  Musculoskeletal:        General: Tenderness and signs of injury present.     Cervical back: Neck supple.     Right lower leg: No edema.     Left lower leg: No edema.     Comments: Left lower extremity ankle and midfoot nontender.  No digital tenderness except for left great toe.  Nail intact.  There is a laceration at the IP.  Cap refill and sensation intact.  Skin:    General: Skin is warm and dry.  Neurological:     General: No focal deficit present.     Mental Status: She is alert.     GCS: GCS eye subscore is 4. GCS verbal subscore is 5. GCS motor subscore is 6.    ED Results / Procedures / Treatments   Labs (all labs ordered are listed, but only abnormal results are displayed) Labs Reviewed - No data to display  EKG None  Radiology DG Foot Complete Left  Result Date: 05/18/2021 CLINICAL DATA:  35 year old female dropped 25 lb weight on foot. Great toe laceration. EXAM: LEFT FOOT - COMPLETE 3+ VIEW COMPARISON:  None. FINDINGS: Highly comminuted fracture of the 1st distal phalanx with mildly displaced fragments. Fracture extends to the IP joint. No tracking soft tissue gas, but laceration there raises the possibility of compound fracture. First proximal phalanx and metatarsal appear intact and normally aligned. Other left foot osseous structures appear intact. Mild calcaneus degenerative spurring. IMPRESSION: Highly comminuted and mildly displaced fracture of the left 1st distal phalanx, with reported laceration there raising suspicion of OPEN FRACTURE. Electronically Signed   By: Odessa Fleming M.D.   On: 05/18/2021 06:55     Procedures .Marland KitchenLaceration Repair  Date/Time: 05/18/2021 10:40 AM Performed by: Terrilee Files, MD Authorized by: Terrilee Files, MD   Consent:    Consent obtained:  Verbal   Consent given by:  Patient   Risks discussed:  Infection, pain, poor cosmetic result,  poor wound healing and retained foreign body   Alternatives discussed:  No treatment and delayed treatment Universal protocol:    Procedure explained and questions answered to patient or proxy's satisfaction: yes   Laceration details:    Location:  Toe   Toe location:  L big toe   Length (cm):  1.5 Pre-procedure details:    Preparation:  Patient was prepped and draped in usual sterile fashion Treatment:    Area cleansed with:  Saline   Amount of cleaning:  Standard   Irrigation solution:  Sterile saline   Irrigation method:  Syringe Skin repair:    Repair method:  Sutures   Suture size:  5-0   Suture material:  Nylon   Suture technique:  Simple interrupted   Number of sutures:  2 Approximation:    Approximation:  Close Repair type:    Repair type:  Simple Post-procedure details:    Dressing:  Bulky dressing   Procedure completion:  Tolerated well, no immediate complications   Medications Ordered in ED Medications  ceFAZolin (ANCEF) IVPB 2g/100 mL premix (has no administration in time range)  ibuprofen (ADVIL) tablet 800 mg (has no administration in time range)    ED Course  I have reviewed the triage vital signs and the nursing notes.  Pertinent labs & imaging results that were available during my care of the patient were reviewed by me and considered in my medical decision making (see chart for details).  Clinical Course as of 05/30/21 0950  Wed May 18, 2021  0915 Discussed with orthopedics PA Margarette Asal who will consult on the patient.  He is recommending IV antibiotics for open fracture. [MB]    Clinical Course User Index [MB] Terrilee Files, MD   MDM Rules/Calculators/A&P                          Differential diagnosis includes fracture, dislocation, contusion, nailbed injury.  X-ray showing comminuted distal phalanx fracture.  With laceration overlying have covered with IV antibiotics and consulted orthopedics.  They are recommending postop shoe and outpatient follow-up.  Patient comfortable plan.  Given prescription for antibiotics pain medication of note for work.  Return instructions discussed  Final Clinical Impression(s) / ED Diagnoses Final diagnoses:  Open displaced fracture of distal phalanx of left great toe, initial encounter    Rx / DC Orders ED Discharge Orders          Ordered    cephALEXin (KEFLEX) 500 MG capsule  4 times daily        05/18/21 1043    HYDROcodone-acetaminophen (NORCO/VICODIN) 5-325 MG tablet  Every 6 hours PRN        05/18/21 1043             Terrilee Files, MD 05/18/21 2045    Terrilee Files, MD 05/30/21 [phone]

## 2021-05-18 NOTE — ED Triage Notes (Signed)
Pt states that she dropped a 25lb weight on her foot today, lac to L great toe.

## 2021-05-18 NOTE — ED Provider Notes (Signed)
Emergency Medicine Provider Triage Evaluation Note  CHARIKA MIKELSON , a 35 y.o. female  was evaluated in triage.  Pt complains of left foot pain after dropping 25 lb weight on foot during lunch break.  Has small lac to toe.  Tetanus UTD.  She is a Agricultural engineer.  Review of Systems  Positive: Left foot pain Negative: numbness  Physical Exam  BP (!) 140/95 (BP Location: Right Arm)   Pulse 89   Temp 98.9 F (37.2 C) (Oral)   Resp 17   SpO2 100%  Gen:   Awake, no distress  Resp:  Normal effort  MSK:   Left great toe with small wound noted adjacent to nail, nailbed itself appears intact  Medical Decision Making  Medically screening exam initiated at 6:07 AM.  Appropriate orders placed.  DAWNA JAKES was informed that the remainder of the evaluation will be completed by another provider, this initial triage assessment does not replace that evaluation, and the importance of remaining in the ED until their evaluation is complete.    Garlon Hatchet, PA-C 05/18/21 4008    Milagros Loll, MD 05/19/21 [phone]

## 2021-05-18 NOTE — Discharge Instructions (Addendum)
You were seen in the emergency department for a left toe injury after dropping a heavy weight on it.  You fractured your left big toe and had a laceration there.  Orthopedics is recommending antibiotics.  We also sent a prescription for some pain medication.  Soap and water daily.  Watch for signs of infection.  Follow-up with Dr. Victorino Dike.  Postop shoe for comfort.

## 2021-05-18 NOTE — Consult Note (Signed)
Reason for Consult:Left great toe fracture Referring Physician: Erasmo Score Time called: 6606 Time at bedside: [address] Kathryn Harvey is an 35 y.o. female.  HPI: Kathryn Harvey was exercising when a 25# weight fell from the bench onto her left foot. She had immediate pain and had suffered a laceration to the toe. She came to the ED where x-rays showed a distal phalanx fx and orthopedic surgery was consulted. She works here at [employer] in [employer].  Past Medical History:  Diagnosis Date   Anxiety    Arthritis    Constipation    Eczema    GERD (gastroesophageal reflux disease)    Hypertension    Obesity    Osteoarthritis     Past Surgical History:  Procedure Laterality Date   LAPAROSCOPIC GASTRIC SLEEVE RESECTION N/A 03/08/2020   Procedure: LAPAROSCOPIC GASTRIC SLEEVE RESECTION, Upper Endo, ERAS Pathway;  Surgeon: Kinsinger, De Blanch, MD;  Location: [facility];  Service: General;  Laterality: N/A;   NO PAST SURGERIES      Family History  Problem Relation Age of Onset   Arthritis Mother    Cancer Mother    Hypertension Mother    Diabetes Sister    Thyroid disease Sister    Hypertension Sister    Asthma Sister    Eczema Maternal Aunt     Social History:  reports that she quit smoking about 22 months ago. Her smoking use included cigars. She has never used smokeless tobacco. She reports that she does not drink alcohol and does not use drugs.  Allergies:  Allergies  Allergen Reactions   Sulfa Antibiotics Swelling    Medications: I have reviewed the patient's current medications.  No results found for this or any previous visit (from the past 48 hour(s)).  DG Foot Complete Left  Result Date: 05/18/2021 CLINICAL DATA:  35 year old female dropped 25 lb weight on foot. Great toe laceration. EXAM: LEFT FOOT - COMPLETE 3+ VIEW COMPARISON:  None. FINDINGS: Highly comminuted fracture of the 1st distal phalanx with mildly displaced fragments. Fracture extends to the IP  joint. No tracking soft tissue gas, but laceration there raises the possibility of compound fracture. First proximal phalanx and metatarsal appear intact and normally aligned. Other left foot osseous structures appear intact. Mild calcaneus degenerative spurring. IMPRESSION: Highly comminuted and mildly displaced fracture of the left 1st distal phalanx, with reported laceration there raising suspicion of OPEN FRACTURE. Electronically Signed   By: Odessa Fleming M.D.   On: 05/18/2021 06:55    Review of Systems  HENT:  Negative for ear discharge, ear pain, hearing loss and tinnitus.   Eyes:  Negative for photophobia and pain.  Respiratory:  Negative for cough and shortness of breath.   Cardiovascular:  Negative for chest pain.  Gastrointestinal:  Negative for abdominal pain, nausea and vomiting.  Genitourinary:  Negative for dysuria, flank pain, frequency and urgency.  Musculoskeletal:  Positive for arthralgias (Left great toe). Negative for back pain, myalgias and neck pain.  Neurological:  Negative for dizziness and headaches.  Hematological:  Does not bruise/bleed easily.  Psychiatric/Behavioral:  The patient is not nervous/anxious.   Blood pressure (!) 140/95, pulse 89, temperature 98.9 F (37.2 C), temperature source Oral, resp. rate 17, SpO2 100 %. Physical Exam Constitutional:      General: She is not in acute distress.    Appearance: She is well-developed. She is not diaphoretic.  HENT:     Head: Normocephalic and atraumatic.  Eyes:  General: No scleral icterus.       Right eye: No discharge.        Left eye: No discharge.     Conjunctiva/sclera: Conjunctivae normal.  Cardiovascular:     Rate and Rhythm: Normal rate and regular rhythm.  Pulmonary:     Effort: Pulmonary effort is normal. No respiratory distress.  Musculoskeletal:     Cervical back: Normal range of motion.  Feet:     Comments: Left foot: Laceration extending proximally from medial proximal nail bed, mod TTP distal  phalanx toe. 2+ DP, 1+PT. SPN/DPN/TN intact. Skin:    General: Skin is warm and dry.  Neurological:     Mental Status: She is alert.  Psychiatric:        Mood and Affect: Mood normal.        Behavior: Behavior normal.    Assessment/Plan: Left great toe distal phalax fx, possibly open -- Will give 2g Ancef, send home on Keflex. EDP to repair laceration. WBAT in post-op shoe. F/u with Dr. Jena Gauss later this week or early next.    Freeman Caldron, PA-C Orthopedic Surgery [phone] 05/18/2021, 10:31 AM

## 2021-07-14 ENCOUNTER — Other Ambulatory Visit: Payer: Self-pay

## 2021-07-14 ENCOUNTER — Encounter: Payer: No Typology Code available for payment source | Attending: General Surgery | Admitting: Skilled Nursing Facility1

## 2021-07-14 DIAGNOSIS — E669 Obesity, unspecified: Secondary | ICD-10-CM | POA: Insufficient documentation

## 2021-07-14 NOTE — Progress Notes (Signed)
Follow-up visit:  Post-Operative sleeve Surgery  Anthropometrics  Start weight at [facility]: 285.7 lbs (date: 12/24/2019) Today's weight: 206.8 lbs  Body Composition Scale 01/19/2021 03/14/2021 04/14/2021 07/14/2021  Weight  lbs 223.6 212.3 209 206.8  BMI 36.9 35 34.4 33.2  Total Body Fat  % 40.7 39.8 38.8 38.5     Visceral Fat 11 10 10 10   Fat-Free Mass  % 59.2 60.6 61.1 61.4     Total Body Water  % 44.1 44.8 45 45.2     Muscle-Mass  lbs 36.9 33.2 33.2 33.2  Body Fat Displacement             Torso  lbs 56.4 51.7 50.2 49.3         Left Leg  lbs 11.2 10.3 10 9.8         Right Leg  lbs 11.2 10.3 10 9.8         Left Arm  lbs 5.6 5.1 5 4.9         Right Arm  lbs 5.6 5.1 5 4.9    Pt states she is getting readjusted to working out after breaking her toe. Pt states her food choices are really easy for her now without issue to in behaviors. Pt states seeing the number sis upsetting so she has been trying to let that go and focus on other things. Pt states she does not see the results due to excess skin so is looking into having it removed but is open to not having the surgery and accepting the extra skin if what she wants is not possible.  Dietitian advised pt her extra skin cannot be tightened by activity to the point where it is gone as it is too much and the elasticity is gone.   Pt state she is getting to a better place of acceptance and feeling comfortable with her changes after not weighing for a while and then weighing and see it stayed the same.   24 hr recall: eating 2-3 snacks per day First meal: protein shake + fruit + 2 rice cakes Snack: rice crackers or 100 calorie ice cream or peanuts or popcorn or fruit or vegetables Lunch:  chicken + green beans + sweet potato Snack: 100 calorie gluten free crackers or ice cream that is 100 calories or string cheese Dinner: salad + chicken + corn chips Snack: sugar free popcicle or yogurt  Beverage: plain water, water + flavorings, 8 ounces  coffee + mirilax + sugar free syrup + cinnomon  Fluid intake: 100 ounces  Medications: See List Supplementation: celebrate and calcium  Physical activity: 40 minutes cardio, A-Wall 30 minutes, jogging 3 miles: 7 days a week; walking steps at work  Using straws: no Drinking while eating: no Having you been chewing well: yes Chewing/swallowing difficulties: no Changes in vision: no Changes to mood/headaches: no Hair loss/Cahnges to skin/Changes to nails: no Any difficulty focusing or concentrating: no Sweating: no Dizziness/Lightheaded: no Palpitations: no  Carbonated beverages: no N/V/D/C/GAS: no Abdominal Pain: no Dumping syndrome: no  Progress Towards Goal(s):  In Progress Teaching method utilized: Visual & Auditory  Demonstrated degree of understanding via: Teach Back  Readiness Level: Action Barriers to learning/adherence to lifestyle change: none identified  Encouraged patient to honor their body's internal hunger and fullness cues.  Throughout the day, check in mentally and rate hunger. Stop eating when satisfied not full regardless of how much food is left on the plate.  Get more if still hungry 20-30 minutes  later.  The key is to honor satisfaction so throughout the meal, rate fullness factor and stop when comfortably satisfied not physically full. The key is to honor hunger and fullness without any feelings of guilt or shame.  Pay attention to what the internal cues are, rather than any external factors. This will enhance the confidence you have in listening to your own body and following those internal cues enabling you to increase how often you eat when you are hungry not out of appetite and stop when you are satisfied not full.  Encouraged pt to continue to eat balanced meals inclusive of non starchy vegetables 2 times a day 7 days a week Encouraged pt to choose lean protein sources: limiting beef, pork, sausage, hotdogs, and lunch meat Encourage pt to choose healthy  fats such as plant based limiting animal fats Encouraged pt to continue to drink a minium 64 fluid ounces with half being plain water to satisfy proper hydration    Goals:  -Be sure to limit eating to every 3 hours not in between -do not eat fried foods   Teaching Method Utilized:  Visual Auditory Hands on  Demonstrated degree of understanding via:  Teach Back   Monitoring/Evaluation:  Dietary intake, exercise, and body weight.

## 2021-12-29 ENCOUNTER — Other Ambulatory Visit: Payer: Self-pay

## 2021-12-29 ENCOUNTER — Encounter: Payer: Self-pay | Admitting: Emergency Medicine

## 2021-12-29 ENCOUNTER — Ambulatory Visit
Admission: EM | Admit: 2021-12-29 | Discharge: 2021-12-29 | Disposition: A | Discharge status: Home / Self Care | Payer: No Typology Code available for payment source | Attending: Nurse Practitioner | Admitting: Nurse Practitioner

## 2021-12-29 DIAGNOSIS — L259 Unspecified contact dermatitis, unspecified cause: Secondary | ICD-10-CM | POA: Diagnosis not present

## 2021-12-29 MED ORDER — METHYLPREDNISOLONE 4 MG PO TBPK: ORAL_TABLET | ORAL | 0 refills | Status: DC

## 2021-12-29 NOTE — ED Provider Notes (Signed)
[facility]    CSN: [id number] Arrival date & time: 12/29/21  0800      History   Chief Complaint Chief Complaint  Patient presents with   Rash    HPI Kathryn Harvey is a 36 y.o. female.   Subjective:   Kathryn Harvey is a 36 y.o. female who presents for evaluation of rash. Rash started 2 weeks ago. Initial distribution: face. Lesions are pink in color and are of raised texture. Rash has not changed over time. It is just to her face and has not spread anywhere else. Rash causes no pain or itching but does make her face feel like it's "stretching." She denies any fever, headache, myalgia, nausea, or vomiting. Patient has a history of similar rash to her abdomen a couple of years ago and was seen by dermatology. The cause of the rash was never identified. She was given a cream which cleared it up. Patient tried using some of the left over cream without any relief in her symptoms. She also has tried benadryl without any changes of the rash. Patient has not had contacts with similar rash. Patient has not had new exposures.  The following portions of the patient's history were reviewed and updated as appropriate: allergies, current medications, past family history, past medical history, past social history, past surgical history, and problem list.    Past Medical History:  Diagnosis Date   Anxiety    Arthritis    Constipation    Eczema    GERD (gastroesophageal reflux disease)    Hypertension    Obesity    Osteoarthritis     Patient Active Problem List   Diagnosis Date Noted   Rash and other nonspecific skin eruption 12/13/2020   Essential hypertension 03/11/2020   Obesity 03/08/2020    Past Surgical History:  Procedure Laterality Date   LAPAROSCOPIC GASTRIC SLEEVE RESECTION N/A 03/08/2020   Procedure: LAPAROSCOPIC GASTRIC SLEEVE RESECTION, Upper Endo, ERAS Pathway;  Surgeon: Kinsinger, De BlanchLuke Aaron, MD;  Location: [facility];  Service: General;  Laterality: N/A;    NO PAST SURGERIES      OB History     Gravida  2   Para      Term      Preterm      AB      Living         SAB      IAB      Ectopic      Multiple      Live Births               Home Medications    Prior to Admission medications   Medication Sig Start Date End Date Taking? Authorizing Provider  methylPREDNISolone (MEDROL DOSEPAK) 4 MG TBPK tablet Take as directed 12/29/21  Yes Prachi Oftedahl, Lelon MastSamantha, FNP  Ascorbic Acid (VITAMIN C) 1000 MG tablet Take 1,000 mg by mouth daily.    [provider]  CALCIUM CITRATE PO Take 500 mg by mouth in the morning, at noon, and at bedtime.    [provider]  cephALEXin (KEFLEX) 500 MG capsule Take 1 capsule (500 mg total) by mouth 4 (four) times daily. 05/18/21   Terrilee FilesButler, Michael C, MD  Cetirizine HCl 10 MG CAPS Take 1 capsule (10 mg total) by mouth daily for 10 days. 12/01/20 12/11/20  Wieters, Hallie C, PA-C  HYDROcodone-acetaminophen (NORCO/VICODIN) 5-325 MG tablet Take 1 tablet by mouth every 6 (six) hours as needed for severe pain. 05/18/21  Terrilee FilesButler, Michael C, MD  losartan (COZAAR) 50 MG tablet Take 0.5 tablets (25 mg total) by mouth daily. 03/07/21   Langston ReusingUkleja, Alexandria U, MD  Multiple Vitamins-Minerals (BARIATRIC MULTIVITAMINS/IRON PO) Take 1 tablet by mouth in the morning and at bedtime.    [provider]  Multiple Vitamins-Minerals (HAIR/SKIN/NAILS/BIOTIN PO) Take 5,000 mg by mouth.    [provider]  PRESCRIPTION MEDICATION "Birth control"--unknown name    [provider]  triamcinolone (KENALOG) 0.1 % Apply 1 application topically 2 (two) times daily as needed. Rash. Do not use on the face, neck, armpits or groin area. Do not use more than 3 weeks in a row. 02/17/21   Langston ReusingUkleja, Alexandria U, MD  Levonorgestrel (SKYLA) 13.5 MG IUD 13.5 mg by Intrauterine route daily.  10/03/20  [provider]  pantoprazole (PROTONIX) 40 MG tablet Take 1 tablet (40 mg total) by mouth daily. 03/09/20  10/03/20  Kinsinger, De BlanchLuke Aaron, MD    Family History Family History  Problem Relation Age of Onset   Arthritis Mother    Cancer Mother    Hypertension Mother    Diabetes Sister    Thyroid disease Sister    Hypertension Sister    Asthma Sister    Eczema Maternal Aunt     Social History Social History   Tobacco Use   Smoking status: Former    Types: Cigars    Quit date: 06/23/2019    Years since quitting: 2.5   Smokeless tobacco: Never  Vaping Use   Vaping Use: Never used  Substance Use Topics   Alcohol use: No    Alcohol/week: 0.0 standard drinks   Drug use: No     Allergies   Sulfa antibiotics   Review of Systems Review of Systems  Constitutional:  Negative for fever.  Gastrointestinal:  Negative for nausea and vomiting.  Musculoskeletal:  Negative for myalgias.  Skin:  Positive for rash.  Neurological:  Negative for headaches.  All other systems reviewed and are negative.   Physical Exam Triage Vital Signs ED Triage Vitals [12/29/21 0820]  Enc Vitals Group     BP 131/70     Pulse Rate 70     Resp 16     Temp 98.3 F (36.8 C)     Temp Source Oral     SpO2 98 %     Weight      Height      Head Circumference      Peak Flow      Pain Score 0     Pain Loc      Pain Edu?      Excl. in GC?    No data found.  Updated Vital Signs BP 131/70 (BP Location: Left Arm)    Pulse 70    Temp 98.3 F (36.8 C) (Oral)    Resp 16    SpO2 98%   Visual Acuity Right Eye Distance:   Left Eye Distance:   Bilateral Distance:    Right Eye Near:   Left Eye Near:    Bilateral Near:     Physical Exam Vitals reviewed.  Constitutional:      General: She is not in acute distress.    Appearance: Normal appearance. She is not ill-appearing, toxic-appearing or diaphoretic.  HENT:     Head: Normocephalic.     Nose: Nose normal.     Mouth/Throat:     Mouth: Mucous membranes are moist.  Eyes:     Conjunctiva/sclera: Conjunctivae normal.  Cardiovascular:      Rate and Rhythm: Normal rate.  Pulmonary:     Effort: Pulmonary effort is normal.  Musculoskeletal:        General: Normal range of motion.     Cervical back: Normal range of motion and neck supple.  Skin:    General: Skin is warm and dry.     Findings: Rash present.     Comments: Erythematous rash noted to the bilateral cheeks of the face. No swelling, surrounding erythema or drainage noted.   Neurological:     General: No focal deficit present.     Mental Status: She is alert and oriented to person, place, and time.  Psychiatric:        Mood and Affect: Mood normal.        Behavior: Behavior normal.     UC Treatments / Results  Labs (all labs ordered are listed, but only abnormal results are displayed) Labs Reviewed - No data to display  EKG   Radiology No results found.  Procedures Procedures (including critical care time)  Medications Ordered in UC Medications - No data to display  Initial Impression / Assessment and Plan / UC Course  I have reviewed the triage vital signs and the nursing notes.  Pertinent labs & imaging results that were available during my care of the patient were reviewed by me and considered in my medical decision making (see chart for details).    36 yo female presenting with a rash to the face for the past 2 weeks.  Rash has not changed over time but not getting better with use of leftover Kenalog cream or Benadryl.  She denies any systemic symptoms.  Patient is alert and oriented.  Afebrile.  Rash noted to the face.  No erythema or swelling.  Airway intact.   Plan:  Medrol Dosepak as directed Observe for signs of superimposed infection and systemic symptoms. Reassurance was given to the patient. Referral to Dermatology if unimproved. Skin moisturizer. Watch for signs of fever or worsening of the rash.  Today's evaluation has revealed no signs of a dangerous process. Discussed diagnosis with patient and/or guardian. Patient and/or  guardian aware of their diagnosis, possible red flag symptoms to watch out for and need for close follow up. Patient and/or guardian understands verbal and written discharge instructions. Patient and/or guardian comfortable with plan and disposition.  Patient and/or guardian has a clear mental status at this time, good insight into illness (after discussion and teaching) and has clear judgment to make decisions regarding their care  This care was provided during an unprecedented National Emergency due to the Novel Coronavirus (COVID-19) pandemic. COVID-19 infections and transmission risks place heavy strains on healthcare resources.  As this pandemic evolves, our facility, providers, and staff strive to respond fluidly, to remain operational, and to provide care relative to available resources and information. Outcomes are unpredictable and treatments are without well-defined guidelines. Further, the impact of COVID-19 on all aspects of urgent care, including the impact to patients seeking care for reasons other than COVID-19, is unavoidable during this national emergency. At this time of the global pandemic, management of patients has significantly changed, even for non-COVID positive patients given high local and regional COVID volumes at this time requiring high healthcare system and resource utilization. The standard of care for management of both COVID suspected and non-COVID suspected patients continues to change rapidly at the local, regional, national, and global levels. This patient was worked up and treated to  the best available but ever changing evidence and resources available at this current time.   Documentation was completed with the aid of voice recognition software. Transcription may contain typographical errors.  Final Clinical Impressions(s) / UC Diagnoses   Final diagnoses:  Contact dermatitis, unspecified contact dermatitis type, unspecified trigger     Discharge Instructions       Take medications as prescribed  Moisturize your skin as needed. Put cool cloths on your skin. Put a baking soda paste on your skin. Stir water into baking soda until it looks like a paste. Do not scratch your skin. Avoid having things rub up against your skin. Avoid the use of soaps, perfumes, and dyes. Follow-up with dermatology if no improvement in symptoms      ED Prescriptions     Medication Sig Dispense Auth. Provider   methylPREDNISolone (MEDROL DOSEPAK) 4 MG TBPK tablet Take as directed 21 tablet Kathryn Idol, FNP      PDMP not reviewed this encounter.   Cathlean Sauer [place], [place] 12/29/21 [phone]

## 2021-12-29 NOTE — ED Triage Notes (Signed)
Acne type rash across cheeks, chin, and lower jaw developing 2 weeks ago. Denies itching and pain.

## 2021-12-29 NOTE — Discharge Instructions (Addendum)
Take medications as prescribed  Moisturize your skin as needed. Put cool cloths on your skin. Put a baking soda paste on your skin. Stir water into baking soda until it looks like a paste. Do not scratch your skin. Avoid having things rub up against your skin. Avoid the use of soaps, perfumes, and dyes. Follow-up with dermatology if no improvement in symptoms

## 2022-07-12 ENCOUNTER — Encounter (INDEPENDENT_AMBULATORY_CARE_PROVIDER_SITE_OTHER): Payer: Self-pay

## 2022-08-15 ENCOUNTER — Other Ambulatory Visit (HOSPITAL_COMMUNITY)
Admission: RE | Admit: 2022-08-15 | Discharge: 2022-08-15 | Disposition: A | Discharge status: Home / Self Care | Payer: BC Managed Care – PPO | Source: Ambulatory Visit | Attending: Nurse Practitioner | Admitting: Nurse Practitioner

## 2022-08-15 DIAGNOSIS — Z3201 Encounter for pregnancy test, result positive: Secondary | ICD-10-CM | POA: Diagnosis not present

## 2022-08-15 DIAGNOSIS — Z124 Encounter for screening for malignant neoplasm of cervix: Secondary | ICD-10-CM | POA: Insufficient documentation

## 2022-08-15 DIAGNOSIS — Z01419 Encounter for gynecological examination (general) (routine) without abnormal findings: Secondary | ICD-10-CM | POA: Diagnosis not present

## 2022-08-15 DIAGNOSIS — Z7251 High risk heterosexual behavior: Secondary | ICD-10-CM | POA: Diagnosis not present

## 2022-08-18 LAB — CYTOLOGY - PAP
Comment: NEGATIVE
Diagnosis: NEGATIVE
High risk HPV: NEGATIVE

## 2022-08-30 ENCOUNTER — Encounter (HOSPITAL_COMMUNITY): Payer: Self-pay

## 2022-08-30 ENCOUNTER — Inpatient Hospital Stay (HOSPITAL_COMMUNITY)
Admission: AD | Admit: 2022-08-30 | Discharge: 2022-08-30 | Disposition: A | Discharge status: Home / Self Care | Payer: BC Managed Care – PPO | Attending: Obstetrics & Gynecology | Admitting: Obstetrics & Gynecology

## 2022-08-30 ENCOUNTER — Inpatient Hospital Stay (HOSPITAL_COMMUNITY): Payer: BC Managed Care – PPO

## 2022-08-30 DIAGNOSIS — Z3A01 Less than 8 weeks gestation of pregnancy: Secondary | ICD-10-CM

## 2022-08-30 DIAGNOSIS — D251 Intramural leiomyoma of uterus: Secondary | ICD-10-CM | POA: Diagnosis not present

## 2022-08-30 DIAGNOSIS — O4691 Antepartum hemorrhage, unspecified, first trimester: Secondary | ICD-10-CM | POA: Diagnosis not present

## 2022-08-30 DIAGNOSIS — O209 Hemorrhage in early pregnancy, unspecified: Secondary | ICD-10-CM | POA: Insufficient documentation

## 2022-08-30 DIAGNOSIS — N83202 Unspecified ovarian cyst, left side: Secondary | ICD-10-CM | POA: Diagnosis not present

## 2022-08-30 DIAGNOSIS — Z3491 Encounter for supervision of normal pregnancy, unspecified, first trimester: Secondary | ICD-10-CM

## 2022-08-30 DIAGNOSIS — O4690 Antepartum hemorrhage, unspecified, unspecified trimester: Secondary | ICD-10-CM

## 2022-08-30 DIAGNOSIS — O3411 Maternal care for benign tumor of corpus uteri, first trimester: Secondary | ICD-10-CM | POA: Diagnosis not present

## 2022-08-30 LAB — CBC
HCT: 34.1 % — ABNORMAL LOW (ref 36.0–46.0)
Hemoglobin: 11.9 g/dL — ABNORMAL LOW (ref 12.0–15.0)
MCH: 30.6 pg (ref 26.0–34.0)
MCHC: 34.9 g/dL (ref 30.0–36.0)
MCV: 87.7 fL (ref 80.0–100.0)
Platelets: 216 10*3/uL (ref 150–400)
RBC: 3.89 MIL/uL (ref 3.87–5.11)
RDW: 13.2 % (ref 11.5–15.5)
WBC: 9 10*3/uL (ref 4.0–10.5)
nRBC: 0 % (ref 0.0–0.2)

## 2022-08-30 LAB — POCT PREGNANCY, URINE: Preg Test, Ur: POSITIVE — AB

## 2022-08-30 LAB — URINALYSIS, ROUTINE W REFLEX MICROSCOPIC
Bilirubin Urine: NEGATIVE
Glucose, UA: NEGATIVE mg/dL
Ketones, ur: 5 mg/dL — AB
Leukocytes,Ua: NEGATIVE
Nitrite: NEGATIVE
Protein, ur: NEGATIVE mg/dL
Specific Gravity, Urine: 1.008 (ref 1.005–1.030)
pH: 6 (ref 5.0–8.0)

## 2022-08-30 LAB — HCG, QUANTITATIVE, PREGNANCY: hCG, Beta Chain, Quant, S: 10788 m[IU]/mL — ABNORMAL HIGH (ref <5)

## 2022-08-30 LAB — WET PREP, GENITAL
Sperm: NONE SEEN
Trich, Wet Prep: NONE SEEN
WBC, Wet Prep HPF POC: <10 (ref <10)
Yeast Wet Prep HPF POC: NONE SEEN

## 2022-08-30 NOTE — MAU Provider Note (Signed)
History     CSN: [id number]  Arrival date and time: 08/30/22 2009   Event Date/Time   First Provider Initiated Contact with Patient 08/30/22 2100      Chief Complaint  Patient presents with   Vaginal Bleeding   HPI  Kathryn Harvey is a 36 y.o. G3P0 at [redacted]w[redacted]d who presents for evaluation of vaginal bleeding. Patient reports she was at work and had a gush of bleeding where she passed a sac like structure and then had another episode where she passed a clot. She denies any pain.  She reports she went to the abortion clinic 2 weeks ago where they did an ultrasound and couldn't see the pregnancy yet. She is supposed to return on Friday 9/29 for another ultrasound.   OB History     Gravida  3   Para  2   Term  2   Preterm      AB      Living  2      SAB      IAB      Ectopic      Multiple      Live Births  2           Past Medical History:  Diagnosis Date   Anxiety    Arthritis    Constipation    Eczema    GERD (gastroesophageal reflux disease)    Hypertension    Obesity    Osteoarthritis     Past Surgical History:  Procedure Laterality Date   LAPAROSCOPIC GASTRIC SLEEVE RESECTION N/A 03/08/2020   Procedure: LAPAROSCOPIC GASTRIC SLEEVE RESECTION, Upper Endo, ERAS Pathway;  Surgeon: Kinsinger, Arta Bruce, MD;  Location: [facility];  Service: General;  Laterality: N/A;   NO PAST SURGERIES     TONSILLECTOMY      Family History  Problem Relation Age of Onset   Arthritis Mother    Cancer Mother    Hypertension Mother    Diabetes Sister    Thyroid disease Sister    Hypertension Sister    Asthma Sister    Eczema Maternal Aunt     Social History   Tobacco Use   Smoking status: Former    Types: Cigars    Quit date: 06/23/2019    Years since quitting: 3.1   Smokeless tobacco: Never  Vaping Use   Vaping Use: Never used  Substance Use Topics   Alcohol use: No    Alcohol/week: 0.0 standard drinks of alcohol   Drug use: No    Allergies:   Allergies  Allergen Reactions   Sulfa Antibiotics Swelling    No medications prior to admission.    Review of Systems  Constitutional: Negative.  Negative for fatigue and fever.  HENT: Negative.    Respiratory: Negative.  Negative for shortness of breath.   Cardiovascular: Negative.  Negative for chest pain.  Gastrointestinal: Negative.  Negative for abdominal pain, constipation, diarrhea, nausea and vomiting.  Genitourinary:  Positive for vaginal bleeding. Negative for dysuria and vaginal discharge.  Neurological: Negative.  Negative for dizziness and headaches.   Physical Exam   Blood pressure 116/75, pulse 79, temperature 98.5 F (36.9 C), temperature source Oral, resp. rate 16, height 5\' 5"  (1.651 m), weight 103.1 kg, last menstrual period 07/15/2022, SpO2 100 %.  Patient Vitals for the past 24 hrs:  BP Pulse  08/30/22 2201 116/75 --  08/30/22 2109 125/73 79    Physical Exam Vitals and nursing note reviewed.  Constitutional:  General: She is not in acute distress.    Appearance: She is well-developed.  HENT:     Head: Normocephalic.  Eyes:     Pupils: Pupils are equal, round, and reactive to light.  Cardiovascular:     Rate and Rhythm: Normal rate and regular rhythm.     Heart sounds: Normal heart sounds.  Pulmonary:     Effort: Pulmonary effort is normal. No respiratory distress.     Breath sounds: Normal breath sounds.  Abdominal:     General: Bowel sounds are normal. There is no distension.     Palpations: Abdomen is soft.     Tenderness: There is no abdominal tenderness.  Skin:    General: Skin is warm and dry.  Neurological:     Mental Status: She is alert and oriented to person, place, and time.  Psychiatric:        Mood and Affect: Mood normal.        Behavior: Behavior normal.        Thought Content: Thought content normal.        Judgment: Judgment normal.    MAU Course  Procedures  Results for orders placed or performed during the  hospital encounter of 08/30/22 (from the past 24 hour(s))  Urinalysis, Routine w reflex microscopic     Status: Abnormal   Collection Time: 08/30/22  9:07 PM  Result Value Ref Range   Color, Urine STRAW (A) YELLOW   APPearance CLEAR CLEAR   Specific Gravity, Urine 1.008 1.005 - 1.030   pH 6.0 5.0 - 8.0   Glucose, UA NEGATIVE NEGATIVE mg/dL   Hgb urine dipstick LARGE (A) NEGATIVE   Bilirubin Urine NEGATIVE NEGATIVE   Ketones, ur 5 (A) NEGATIVE mg/dL   Protein, ur NEGATIVE NEGATIVE mg/dL   Nitrite NEGATIVE NEGATIVE   Leukocytes,Ua NEGATIVE NEGATIVE   WBC, UA 0-5 0 - 5 WBC/hpf   Bacteria, UA RARE (A) NONE SEEN   Squamous Epithelial / LPF 0-5 0 - 5  Wet prep, genital     Status: Abnormal   Collection Time: 08/30/22  9:09 PM  Result Value Ref Range   Yeast Wet Prep HPF POC NONE SEEN NONE SEEN   Trich, Wet Prep NONE SEEN NONE SEEN   Clue Cells Wet Prep HPF POC PRESENT (A) NONE SEEN   WBC, Wet Prep HPF POC <10 <10   Sperm NONE SEEN      US OB LESS THAN 14 WEEKS WITH OB TRANSVAGINAL  Result Date: 08/30/2022 CLINICAL DATA:  Heavy bleeding EXAM: OBSTETRIC <14 WK [place] AND TRANSVAGINAL OB US TECHNIQUE: Both transabdominal and transvaginal ultrasound examinations were performed for complete evaluation of the gestation as well as the maternal uterus, adnexal regions, and pelvic cul-de-sac. Transvaginal technique was performed to assess early pregnancy. COMPARISON:  None Available. FINDINGS: Intrauterine gestational sac: Single intrauterine gestational sac Yolk sac:  Visualized Embryo:  Visualized Cardiac Activity: Visualized Heart Rate: 116 bpm CRL:  4.1 mm 6 w   1 d                  [place] EDC: 04/24/2023 Subchorionic hemorrhage:  None visualized. Maternal uterus/adnexae: Ovaries are within normal limits. Right ovary measures 3.2 x 2 by 2.7 cm. The left ovary measures 2.9 x 1.9 x 2.2 cm. Left paraovarian cyst measuring 2.7 x 1.2 x 1.7 cm, no imaging follow-up is recommended. Several uterine fibroids.  Anterior uterine corpus fibroid measures 10 x 15 x 12 mm. Intramural anterior lower uterine segment  fibroid measures 18 x 11 x 16 mm. No significant free fluid IMPRESSION: 1. Single viable intrauterine pregnancy as above. 2. Small uterine fibroids Electronically Signed   By: Donavan Foil M.D.   On: 08/30/2022 21:50     MDM Labs ordered and reviewed.   UA, UPT CBC, HCG ABO/Rh- O Pos Wet prep and gc/chlamydia US OB Comp Less 14 weeks with Transvaginal  CNM independently reviewed the imaging ordered. Imaging show live IUP measuring 6 weeks  Assessment and Plan   1. Normal intrauterine pregnancy on prenatal ultrasound in first trimester   2. [redacted] weeks gestation of pregnancy   3. Vaginal bleeding in pregnancy     -Discharge home in stable condition -First trimester precautions discussed -Patient may return to MAU as needed or if her condition were to change or worsen  Wende Mott, CNM 08/31/2022, 9:00 PM

## 2022-08-30 NOTE — MAU Note (Signed)
..  Kathryn Harvey is a 36 y.o. at Unknown here in MAU reporting: Vaginal bleeding that began an hour ago, she saw a sack and clots. Denies pain   Reports she found out she was pregnant [redacted] weeks ago. LMP: 07/15/2022  Pain score: 0/10 Vitals:   08/30/22 2027  BP: 132/76  Pulse: 72  Resp: 16  Temp: 98.5 F (36.9 C)  SpO2: 100%     FHT:not indicated

## 2022-08-30 NOTE — Discharge Instructions (Signed)

## 2022-08-31 LAB — GC/CHLAMYDIA PROBE AMP (~~LOC~~) NOT AT ARMC
Chlamydia: NEGATIVE
Comment: NEGATIVE
Comment: NORMAL
Neisseria Gonorrhea: NEGATIVE

## 2022-09-29 ENCOUNTER — Encounter (HOSPITAL_COMMUNITY): Payer: Self-pay | Admitting: *Deleted

## 2023-09-27 ENCOUNTER — Encounter (HOSPITAL_COMMUNITY): Payer: Self-pay | Admitting: *Deleted

## 2023-10-05 ENCOUNTER — Other Ambulatory Visit: Payer: Self-pay | Admitting: Nurse Practitioner

## 2023-10-05 DIAGNOSIS — Z1231 Encounter for screening mammogram for malignant neoplasm of breast: Secondary | ICD-10-CM

## 2024-02-19 ENCOUNTER — Telehealth: Payer: Self-pay | Admitting: Emergency Medicine

## 2024-02-19 ENCOUNTER — Ambulatory Visit: Admission: EM | Admit: 2024-02-19 | Discharge: 2024-02-19 | Disposition: A | Discharge status: Home / Self Care

## 2024-02-19 ENCOUNTER — Encounter: Payer: Self-pay | Admitting: Emergency Medicine

## 2024-02-19 DIAGNOSIS — L02612 Cutaneous abscess of left foot: Secondary | ICD-10-CM | POA: Diagnosis not present

## 2024-02-19 MED ORDER — DOXYCYCLINE HYCLATE 100 MG PO CAPS: 100.0000 mg | ORAL_CAPSULE | Freq: Two times a day (BID) | ORAL | 0 refills | Status: AC

## 2024-02-19 MED ORDER — DOXYCYCLINE HYCLATE 100 MG PO CAPS: 100.0000 mg | ORAL_CAPSULE | Freq: Two times a day (BID) | ORAL | 0 refills | Status: DC

## 2024-02-19 NOTE — ED Triage Notes (Addendum)
 Pt presents with what she believes is a boil on her left big toe. The area I painful to touch but pt says it wasn't before. She first noticed two months ago and was given antibiotics for it but the med caused irritation in vaginal area so she stopped taking it.

## 2024-02-19 NOTE — ED Provider Notes (Signed)
 [facility]    CSN: [id number] Arrival date & time: 02/19/24  4098      History   Chief Complaint Chief Complaint  Patient presents with   Abscess    HPI Kathryn Harvey is a 38 y.o. female.   Patient presents with concern of abscess to left big toe that has been there for approximately 2 months or so.  Was recently seen by PCP in january who prescribed cephalexin.  Although, patient stopped taking this medication after 3 days given that it caused a vaginal yeast infection.  She reports minimal improvement with the medication while taking it.  Reports she has had some purulent drainage.  Denies any associated fever.   Abscess   Past Medical History:  Diagnosis Date   Anxiety    Arthritis    Constipation    Eczema    GERD (gastroesophageal reflux disease)    Hypertension    Obesity    Osteoarthritis     Patient Active Problem List   Diagnosis Date Noted   Rash and other nonspecific skin eruption 12/13/2020   Essential hypertension 03/11/2020   Obesity 03/08/2020    Past Surgical History:  Procedure Laterality Date   LAPAROSCOPIC GASTRIC SLEEVE RESECTION N/A 03/08/2020   Procedure: LAPAROSCOPIC GASTRIC SLEEVE RESECTION, Upper Endo, ERAS Pathway;  Surgeon: Kinsinger, De Blanch, MD;  Location: [facility];  Service: General;  Laterality: N/A;   NO PAST SURGERIES     TONSILLECTOMY      OB History     Gravida  3   Para  2   Term  2   Preterm      AB      Living  2      SAB      IAB      Ectopic      Multiple      Live Births  2            Home Medications    Prior to Admission medications   Medication Sig Start Date End Date Taking? Authorizing Provider  ferrous sulfate 325 (65 FE) MG tablet 1 tablet Orally Three times a Week   Yes [provider]  fluconazole (DIFLUCAN) 150 MG tablet 1 tablet Orally Today, then take second tablet in 3 days for 2 days 10/30/23  Yes [provider]  metroNIDAZOLE (FLAGYL)  500 MG tablet 1 tablet Orally Twice a day for 7 days 10/30/23  Yes [provider]  Multiple Vitamin (MULTI VITAMIN) TABS 1 tablet Orally Once a day   Yes [provider]  Cetirizine HCl 10 MG CAPS Take 1 capsule (10 mg total) by mouth daily for 10 days. 12/01/20 12/11/20  Wieters, Hallie C, PA-C  doxycycline (VIBRAMYCIN) 100 MG capsule Take 1 capsule (100 mg total) by mouth 2 (two) times daily. 02/19/24   Gustavus Bryant, FNP  Multiple Vitamins-Minerals (HAIR/SKIN/NAILS/BIOTIN PO) Take 5,000 mg by mouth.    [provider]  norethindrone (MICRONOR) 0.35 MG tablet Take 1 tablet by mouth daily.    [provider]  Levonorgestrel (SKYLA) 13.5 MG IUD 13.5 mg by Intrauterine route daily.  10/03/20  [provider]  pantoprazole (PROTONIX) 40 MG tablet Take 1 tablet (40 mg total) by mouth daily. 03/09/20 10/03/20  Kinsinger, De Blanch, MD    Family History Family History  Problem Relation Age of Onset   Arthritis Mother    Cancer Mother    Hypertension Mother    Diabetes Sister  Thyroid disease Sister    Hypertension Sister    Asthma Sister    Eczema Maternal Aunt     Social History Social History   Tobacco Use   Smoking status: Former    Types: Cigars    Quit date: 06/23/2019    Years since quitting: 4.6   Smokeless tobacco: Never  Vaping Use   Vaping status: Never Used  Substance Use Topics   Alcohol use: No    Alcohol/week: 0.0 standard drinks of alcohol   Drug use: No     Allergies   Sulfa antibiotics   Review of Systems Review of Systems Per HPI  Physical Exam Triage Vital Signs ED Triage Vitals  Encounter Vitals Group     BP 02/19/24 0858 127/80     Systolic BP Percentile --      Diastolic BP Percentile --      Pulse Rate 02/19/24 0858 (!) 52     Resp 02/19/24 0858 16     Temp 02/19/24 0858 98.7 F (37.1 C)     Temp Source 02/19/24 0858 Oral     SpO2 02/19/24 0858 95 %     Weight --      Height --      Head  Circumference --      Peak Flow --      Pain Score 02/19/24 0854 3     Pain Loc --      Pain Education --      Exclude from Growth Chart --    No data found.  Updated Vital Signs BP 127/80 (BP Location: Left Arm)   Pulse 62   Temp 98.7 F (37.1 C) (Oral)   Resp 16   LMP 02/13/2024   SpO2 95%   Breastfeeding No   Visual Acuity Right Eye Distance:   Left Eye Distance:   Bilateral Distance:    Right Eye Near:   Left Eye Near:    Bilateral Near:     Physical Exam Constitutional:      General: She is not in acute distress.    Appearance: Normal appearance. She is not toxic-appearing or diaphoretic.  HENT:     Head: Normocephalic and atraumatic.  Eyes:     Extraocular Movements: Extraocular movements intact.     Conjunctiva/sclera: Conjunctivae normal.  Pulmonary:     Effort: Pulmonary effort is normal.  Musculoskeletal:       Feet:  Feet:     Comments: Patient has approximately 1.5 cm in diameter indurated abscess with no purulent drainage present to the medial left great toe directly beside nailbed.  Nail is intact. Neurological:     General: No focal deficit present.     Mental Status: She is alert and oriented to person, place, and time. Mental status is at baseline.  Psychiatric:        Mood and Affect: Mood normal.        Behavior: Behavior normal.        Thought Content: Thought content normal.        Judgment: Judgment normal.      UC Treatments / Results  Labs (all labs ordered are listed, but only abnormal results are displayed) Labs Reviewed - No data to display  EKG   Radiology No results found.  Procedures Procedures (including critical care time)  Medications Ordered in UC Medications - No data to display  Initial Impression / Assessment and Plan / UC Course  I have reviewed the triage vital signs  and the nursing notes.  Pertinent labs & imaging results that were available during my care of the patient were reviewed by me and  considered in my medical decision making (see chart for details).     Patient has abscess to left great toe.  No I&D indicated.  Will treat with doxycycline.  Advised supportive care including warm compresses.  Advised strict follow-up precautions.  Patient verbalized understanding and was agreeable with plan. Final Clinical Impressions(s) / UC Diagnoses   Final diagnoses:  Abscess, toe, left     Discharge Instructions      I have prescribed you an antibiotic for abscess.  Follow-up if symptoms persist or worsen.  Take medication with food.    ED Prescriptions     Medication Sig Dispense Auth. Provider   doxycycline (VIBRAMYCIN) 100 MG capsule Take 1 capsule (100 mg total) by mouth 2 (two) times daily. 20 capsule Gustavus Bryant, [place]      PDMP not reviewed this encounter.   Gustavus Bryant, [place] 02/19/24 1024

## 2024-02-19 NOTE — Discharge Instructions (Signed)
 I have prescribed you an antibiotic for abscess.  Follow-up if symptoms persist or worsen.  Take medication with food.

## 2024-03-17 ENCOUNTER — Ambulatory Visit
Admission: RE | Admit: 2024-03-17 | Discharge: 2024-03-17 | Disposition: A | Discharge status: Home / Self Care | Payer: BC Managed Care – PPO | Source: Ambulatory Visit | Attending: Nurse Practitioner | Admitting: Nurse Practitioner

## 2024-03-17 DIAGNOSIS — Z1231 Encounter for screening mammogram for malignant neoplasm of breast: Secondary | ICD-10-CM

## 2024-03-20 ENCOUNTER — Other Ambulatory Visit: Payer: Self-pay | Admitting: Nurse Practitioner

## 2024-03-20 DIAGNOSIS — R928 Other abnormal and inconclusive findings on diagnostic imaging of breast: Secondary | ICD-10-CM

## 2024-04-01 ENCOUNTER — Ambulatory Visit
Admission: RE | Admit: 2024-04-01 | Discharge: 2024-04-01 | Disposition: A | Discharge status: Home / Self Care | Source: Ambulatory Visit | Attending: Nurse Practitioner | Admitting: Nurse Practitioner

## 2024-04-01 ENCOUNTER — Other Ambulatory Visit: Payer: Self-pay | Admitting: Nurse Practitioner

## 2024-04-01 DIAGNOSIS — R928 Other abnormal and inconclusive findings on diagnostic imaging of breast: Secondary | ICD-10-CM

## 2024-04-01 DIAGNOSIS — N6489 Other specified disorders of breast: Secondary | ICD-10-CM

## 2024-04-07 ENCOUNTER — Ambulatory Visit
Admission: RE | Admit: 2024-04-07 | Discharge: 2024-04-07 | Disposition: A | Discharge status: Home / Self Care | Source: Ambulatory Visit | Attending: Nurse Practitioner | Admitting: Nurse Practitioner

## 2024-04-07 DIAGNOSIS — N6489 Other specified disorders of breast: Secondary | ICD-10-CM

## 2024-04-07 HISTORY — PX: BREAST BIOPSY: SHX20

## 2024-04-08 LAB — SURGICAL PATHOLOGY

## 2024-10-02 ENCOUNTER — Encounter (HOSPITAL_COMMUNITY): Payer: Self-pay | Admitting: *Deleted
